# Patient Record
Sex: Male | Born: 1975 | Race: Asian | Hispanic: No | Marital: Married | Smoking: Never smoker
Health system: Southern US, Community
[De-identification: ages and names within clinical notes are randomized; demographics above are authoritative.]

## PROBLEM LIST (undated history)

## (undated) DIAGNOSIS — R739 Hyperglycemia, unspecified: Secondary | ICD-10-CM

## (undated) DIAGNOSIS — E785 Hyperlipidemia, unspecified: Secondary | ICD-10-CM

## (undated) DIAGNOSIS — S92009A Unspecified fracture of unspecified calcaneus, initial encounter for closed fracture: Secondary | ICD-10-CM

## (undated) DIAGNOSIS — I10 Essential (primary) hypertension: Secondary | ICD-10-CM

## (undated) DIAGNOSIS — R945 Abnormal results of liver function studies: Secondary | ICD-10-CM

## (undated) DIAGNOSIS — K760 Fatty (change of) liver, not elsewhere classified: Secondary | ICD-10-CM

## (undated) DIAGNOSIS — R7989 Other specified abnormal findings of blood chemistry: Secondary | ICD-10-CM

## (undated) HISTORY — DX: Essential (primary) hypertension: I10

## (undated) HISTORY — DX: Hyperlipidemia, unspecified: E78.5

## (undated) HISTORY — DX: Abnormal results of liver function studies: R94.5

## (undated) HISTORY — DX: Fatty (change of) liver, not elsewhere classified: K76.0

## (undated) HISTORY — DX: Other specified abnormal findings of blood chemistry: R79.89

## (undated) HISTORY — DX: Hyperglycemia, unspecified: R73.9

## (undated) HISTORY — DX: Unspecified fracture of unspecified calcaneus, initial encounter for closed fracture: S92.009A

---

## 2005-06-18 ENCOUNTER — Encounter: Payer: Self-pay | Admitting: Internal Medicine

## 2005-07-18 ENCOUNTER — Encounter: Payer: Self-pay | Admitting: Internal Medicine

## 2005-08-01 ENCOUNTER — Encounter: Admission: RE | Admit: 2005-08-01 | Discharge: 2005-08-01 | Payer: Self-pay | Admitting: Family Medicine

## 2005-08-01 ENCOUNTER — Encounter (INDEPENDENT_AMBULATORY_CARE_PROVIDER_SITE_OTHER): Payer: Self-pay | Admitting: *Deleted

## 2006-05-01 ENCOUNTER — Ambulatory Visit: Payer: Self-pay | Admitting: Internal Medicine

## 2006-10-15 ENCOUNTER — Encounter: Payer: Self-pay | Admitting: Internal Medicine

## 2006-10-15 ENCOUNTER — Emergency Department (HOSPITAL_COMMUNITY): Admission: EM | Admit: 2006-10-15 | Discharge: 2006-10-16 | Payer: Self-pay | Admitting: Emergency Medicine

## 2008-06-15 ENCOUNTER — Encounter: Payer: Self-pay | Admitting: Internal Medicine

## 2009-06-26 ENCOUNTER — Ambulatory Visit: Payer: Self-pay | Admitting: Internal Medicine

## 2009-06-26 DIAGNOSIS — E785 Hyperlipidemia, unspecified: Secondary | ICD-10-CM

## 2009-06-26 DIAGNOSIS — R740 Nonspecific elevation of levels of transaminase and lactic acid dehydrogenase [LDH]: Secondary | ICD-10-CM

## 2009-06-26 DIAGNOSIS — K7689 Other specified diseases of liver: Secondary | ICD-10-CM

## 2009-06-30 ENCOUNTER — Ambulatory Visit: Payer: Self-pay | Admitting: Internal Medicine

## 2009-06-30 LAB — CONVERTED CEMR LAB
AST: 53 units/L — ABNORMAL HIGH (ref 0–37)
Alkaline Phosphatase: 50 units/L (ref 39–117)
Bilirubin, Direct: 0.2 mg/dL (ref 0.0–0.3)
Calcium: 9.3 mg/dL (ref 8.4–10.5)
Direct LDL: 145.6 mg/dL
GFR calc non Af Amer: 102.53 mL/min (ref >60)
Glucose, Bld: 108 mg/dL — ABNORMAL HIGH (ref 70–99)
HDL: 34 mg/dL — ABNORMAL LOW (ref >39.00)
Hgb A1c MFr Bld: 5.4 % (ref 4.6–6.5)
Iron: 111 ug/dL (ref 42–165)
Potassium: 4.5 meq/L (ref 3.5–5.1)
Sodium: 140 meq/L (ref 135–145)
TSH: 1.32 microintl units/mL (ref 0.35–5.50)
Total Bilirubin: 1.5 mg/dL — ABNORMAL HIGH (ref 0.3–1.2)
Total CHOL/HDL Ratio: 6
VLDL: 38.6 mg/dL (ref 0.0–40.0)

## 2009-07-05 ENCOUNTER — Telehealth: Payer: Self-pay | Admitting: Internal Medicine

## 2009-09-06 ENCOUNTER — Telehealth: Payer: Self-pay | Admitting: Internal Medicine

## 2009-09-06 ENCOUNTER — Ambulatory Visit: Payer: Self-pay | Admitting: Internal Medicine

## 2009-09-06 LAB — CONVERTED CEMR LAB
ALT: 66 units/L — ABNORMAL HIGH (ref 0–53)
Bilirubin, Direct: 0.2 mg/dL (ref 0.0–0.3)
Direct LDL: 153.2 mg/dL
HDL: 31.9 mg/dL — ABNORMAL LOW (ref >39.00)
Total Bilirubin: 1.6 mg/dL — ABNORMAL HIGH (ref 0.3–1.2)
Triglycerides: 94 mg/dL (ref 0.0–149.0)
VLDL: 18.8 mg/dL (ref 0.0–40.0)

## 2009-10-31 ENCOUNTER — Telehealth: Payer: Self-pay | Admitting: Internal Medicine

## 2009-11-02 ENCOUNTER — Encounter: Payer: Self-pay | Admitting: Internal Medicine

## 2009-11-06 ENCOUNTER — Ambulatory Visit: Payer: Self-pay | Admitting: Internal Medicine

## 2009-11-06 LAB — CONVERTED CEMR LAB
ALT: 112 units/L — ABNORMAL HIGH (ref 0–53)
AST: 70 units/L — ABNORMAL HIGH (ref 0–37)
Bilirubin, Direct: 0.2 mg/dL (ref 0.0–0.3)
Cholesterol: 119 mg/dL (ref 0–200)
Indirect Bilirubin: 0.5 mg/dL (ref 0.0–0.9)
Total CHOL/HDL Ratio: 3.2
Total Protein: 7.2 g/dL (ref 6.0–8.3)
Triglycerides: 142 mg/dL (ref <150)
VLDL: 28 mg/dL (ref 0–40)

## 2009-11-12 ENCOUNTER — Telehealth: Payer: Self-pay | Admitting: Internal Medicine

## 2009-12-26 ENCOUNTER — Encounter (INDEPENDENT_AMBULATORY_CARE_PROVIDER_SITE_OTHER): Payer: Self-pay | Admitting: *Deleted

## 2009-12-26 ENCOUNTER — Ambulatory Visit: Payer: Self-pay | Admitting: Internal Medicine

## 2010-02-06 ENCOUNTER — Ambulatory Visit: Payer: Self-pay | Admitting: Internal Medicine

## 2010-02-06 LAB — CONVERTED CEMR LAB
ALT: 47 units/L (ref 0–53)
Anti Nuclear Antibody(ANA): NEGATIVE
IgA: 287 mg/dL (ref 68–378)
Tissue Transglutaminase Ab, IgA: 0.7 units (ref <7)
Total Protein: 7.3 g/dL (ref 6.0–8.3)

## 2010-02-14 ENCOUNTER — Encounter: Payer: Self-pay | Admitting: Internal Medicine

## 2010-02-16 ENCOUNTER — Telehealth: Payer: Self-pay | Admitting: Internal Medicine

## 2010-03-16 ENCOUNTER — Telehealth: Payer: Self-pay | Admitting: Internal Medicine

## 2010-03-21 ENCOUNTER — Ambulatory Visit: Payer: Self-pay | Admitting: Internal Medicine

## 2010-03-22 LAB — CONVERTED CEMR LAB
ALT: 51 units/L (ref 0–53)
AST: 27 units/L (ref 0–37)
Alkaline Phosphatase: 50 units/L (ref 39–117)
Bilirubin, Direct: 0.1 mg/dL (ref 0.0–0.3)
Total Bilirubin: 0.7 mg/dL (ref 0.3–1.2)
Total Protein: 7 g/dL (ref 6.0–8.3)

## 2010-06-21 ENCOUNTER — Telehealth: Payer: Self-pay | Admitting: Internal Medicine

## 2010-07-10 ENCOUNTER — Ambulatory Visit: Payer: Self-pay | Admitting: Internal Medicine

## 2010-07-10 LAB — CONVERTED CEMR LAB
ALT: 48 units/L (ref 0–53)
AST: 29 units/L (ref 0–37)
Albumin: 4.2 g/dL (ref 3.5–5.2)
Alkaline Phosphatase: 54 units/L (ref 39–117)
Bilirubin, Direct: 0.1 mg/dL (ref 0.0–0.3)
Cholesterol: 215 mg/dL — ABNORMAL HIGH (ref 0–200)
Direct LDL: 152.4 mg/dL
HDL: 34.5 mg/dL — ABNORMAL LOW (ref >39.00)
Total Bilirubin: 0.9 mg/dL (ref 0.3–1.2)
Total CHOL/HDL Ratio: 6
Total Protein: 6.8 g/dL (ref 6.0–8.3)
Triglycerides: 147 mg/dL (ref 0.0–149.0)
VLDL: 29.4 mg/dL (ref 0.0–40.0)

## 2010-07-13 ENCOUNTER — Ambulatory Visit: Payer: Self-pay | Admitting: Internal Medicine

## 2010-08-17 ENCOUNTER — Telehealth: Payer: Self-pay | Admitting: Internal Medicine

## 2010-08-17 ENCOUNTER — Ambulatory Visit: Payer: Self-pay | Admitting: Internal Medicine

## 2010-08-17 LAB — CONVERTED CEMR LAB
ALT: 64 units/L — ABNORMAL HIGH (ref 0–53)
AST: 33 units/L (ref 0–37)
Alkaline Phosphatase: 60 units/L (ref 39–117)
Bilirubin, Direct: 0.1 mg/dL (ref 0.0–0.3)
LDL Cholesterol: 82 mg/dL (ref 0–99)
Total Bilirubin: 1 mg/dL (ref 0.3–1.2)
Total CHOL/HDL Ratio: 5
Triglycerides: 193 mg/dL — ABNORMAL HIGH (ref 0.0–149.0)

## 2010-10-08 ENCOUNTER — Ambulatory Visit: Payer: Self-pay | Admitting: Internal Medicine

## 2010-10-25 ENCOUNTER — Ambulatory Visit: Payer: Self-pay | Admitting: Internal Medicine

## 2011-01-09 ENCOUNTER — Telehealth: Payer: Self-pay | Admitting: Internal Medicine

## 2011-01-29 NOTE — Progress Notes (Signed)
Summary: Triage  Phone Note Call from Patient Call back at Home Phone [phone]   Caller: Patient Call For: Dr. Leone Payor Reason for Call: Talk to Nurse Summary of Call: Pt. would like to talk to you about the bloodwork that he is sch'd to have done. Initial call taken by: Karna Christmas,  March 16, 2010 10:17 AM  Follow-up for Phone Call        Patient  is requesting we add on a fasting lipid panel to his labs.  Is this ok? Follow-up by: Darcey Nora RN, CGRN,  March 16, 2010 10:19 AM  Additional Follow-up for Phone Call Additional follow up Details #1::        yes he has hyperlipidemia and ? of fatty liver, too Additional Follow-up by: Iva Boop MD, Clementeen Graham,  March 16, 2010 10:48 AM    Additional Follow-up for Phone Call Additional follow up Details #2::    new orders added to lab, patient aware to come for labs at his convenience and be fasting. Follow-up by: Darcey Nora RN, CGRN,  March 16, 2010 10:59 AM

## 2011-01-29 NOTE — Assessment & Plan Note (Signed)
Summary: 6 MONTH FOLLOW UP/MHF   Vital Signs:  Patient profile:   35 year old male Height:      71 inches Weight:      209.75 pounds BMI:     29.36 O2 Sat:      98 % on Room air Temp:     97.9 degrees F oral Pulse rate:   56 / minute Pulse rhythm:   regular Resp:     20 per minute BP sitting:   122 / 80  (right arm) Cuff size:   large  Vitals Entered By: Glendell Docker CMA (July 13, 2010 8:37 AM)  O2 Flow:  Room air CC: Rm 3- 6 Month Follow up  Is Patient Diabetic? No Pain Assessment Patient in pain? no       Does patient need assistance? Functional Status Self care Ambulation Normal Comments no concerns   Primary Care Provider:  DThomos Lemons DO  CC:  Rm 3- 6 Month Follow up .  History of Present Illness: 35 y/o Asian male for f/u re:  fatty liver and abnl LFTs GI work up reviewed  started to make some dietary changes not exercising regularly    Preventive Screening-Counseling & Management  Alcohol-Tobacco     Smoking Status: never  Allergies (verified): No Known Drug Allergies  Past History:  Past Medical History: Hyperlipidemia Hx of fatty liver   Hx of abnormal LFTs presumed secondary to lovastatin and fatty liver   Past Surgical History: NONE    Family History: DM-MOTHER HTN-MOTHER STORKE-MATERNAL GF CAD- MATERNAL UNCLE     No FH of Colon Cancer:    Physical Exam  General:  alert, well-developed, and well-nourished.   Neck:  No deformities, masses, or tenderness noted.no carotid bruits.   Lungs:  normal respiratory effort, normal breath sounds, and no wheezes.   Heart:  normal rate, regular rhythm, and no gallop.     Impression & Recommendations:  Problem # 1:  FATTY LIVER DISEASE (ICD-571.8) LFTs improved. He defers trial of metformin or actos He is motivated to start lifestyle / dietary changes  Problem # 2:  HYPERLIPIDEMIA (ICD-272.4) Crestor and pravastatin caused LFT elevation  trial of Livalo.  samples  provided  His updated medication list for this problem includes:    Livalo 1 Mg Tabs (Pitavastatin calcium) ..... One by mouth once daily  Complete Medication List: 1)  Fish Oil Oil (Fish oil) .... One tablet by mouth once daily 2)  One-a-day Mens Tabs (Multiple vitamin) .... One tablet by mouth once daily 3)  Livalo 1 Mg Tabs (Pitavastatin calcium) .... One by mouth once daily  Patient Instructions: 1)  Avoid high fructose corn syrup and refined sugar 2)  Start regular exercise program 3)  Follow Mediterranean diet 4)  Please schedule a follow-up appointment in 3 months. 5)  Hepatic Panel prior to visit, ICD-9: 272.4 6)  Lipid Panel prior to visit, ICD-9: 272.4 7)  Please return for lab work within one month of starting Livalo Prescriptions: LIVALO 1 MG TABS (PITAVASTATIN CALCIUM) one by mouth once daily  #30 x 5   Entered and Authorized by:   D. Thomos Lemons DO   Signed by:   D. Thomos Lemons DO on 07/13/2010   Method used:   Print then Give to Patient   RxID:   [id number]   Current Allergies (reviewed today): No known allergies

## 2011-01-29 NOTE — Assessment & Plan Note (Signed)
Summary: Abnormal Ferritin and liver chemistries  [name] [name] MR#:  [id number] Page #  NAME:  [name] [name]  OFFICE NO:  [id number]  DATE:  05/01/06  DOB:  11/23/1976  REQUESTING PHYSICIAN:  Mosetta Putt, MD  REASON FOR CONSULTATION:  Abnormal ferritin and liver chemistries.  ASSESSMENT:  Clinical scenario is most compatible with nonalcoholic steatohepatitis or just fatty liver.  Cannot distinguish hepatitis without a liver biopsy.  I think his abnormal transaminases, which are in the 1-1/2 to 3 times abnormal range, are almost certainly related to that.  Ferritin is mildly elevated at 337, iron saturation is 28%, I do not think he has hemochromatosis, plus he is Asian, making that much less likely.  He is overweight, with a body mass index of approximately 28.  His mother has diabetes and is overweight and has also had heart disease.  He (and she) must have metabolic syndrome problems.  RECOMMENDATIONS AND PLAN:   1.  Weight loss is discussed and he is advised to look into [employer] or [employer] or discuss further with Dr. Duaine Dredge. 2.  I think it is reasonable to start statin therapy for his hyperlipidemia.  Though there are concerns about the elevated LFTs, statins have been proven to be safe in chronic liver disease in recent studies that have been released; in fact, they may even help his problem and drive his LFTs down by improving fatty liver.  Obviously, if they rise in the face of this then we would need to look further into things.  A liver biopsy could be indicated, but I do not think that would alter therapy right now so I am not advising that.   3.  I did discuss with him the importance of acting now and losing weight, modifying his diet, continuing exercise and treating these problems so that he does not develop vascular disease and diabetes later on in life.  A fatty liver handout was given to the patient as well. 4.  I have recommended he follow up with Dr. Duaine Dredge  regarding additional treatment.  I do not think any further liver workup is needed right at this time.  I would be happy to see him back if needed.  Check LFT's every 4-6 weeks to start.  HISTORY:  A pleasant 35 year old [place] man with the above problems.  Laboratory testing revealed abnormal liver chemistries.  He does not have any obvious symptoms.  On April 03, 2006 his AST was 77, ALT 133 with normal albumin, bilirubin, etc.  Alkaline phosphatase was normal as well.  Total cholesterol 236, triglycerides 210, HDL 36, VLDL 42 and LDL 158.  His ferritin is 337 with ceruloplasmin once was slightly low on a study of 17.3, but then a subsequent repeat showed it to be in the normal range of 17.2.  His AST was 45, ALT 106 in August of 2006.  Hemoglobin was slightly high at 16.5 with hematocrit 46%, AST 56 and ALT 144 on July 18, 2005.  Cholesterol was 219 then.  Triglycerides 180.  TSH normal.  Hepatitis C antibody, hepatitis B surface antigen, hepatitis B core antibody normal on July 18, 2005.  Ferritin was 324 then.  His ANA was negative at that time.  He was not immune to hepatitis B.  He is being administered hepatitis B vaccine, which is reasonable.  CT scan of the abdomen and pelvis has demonstrated diffuse low attenuation of the liver consistent with fatty liver.  He does not drink alcohol.  He has no known history of liver problems.    MEDICATIONS:  Fish oil and One-A-Day vitamin.   DRUG ALLERGIES:  None known.  PAST MEDICAL HISTORY: Shows what is mentioned above.    FAMILY HISTORY:  Is as described above.  SOCIAL HISTORY:  He is married.  He is an Systems developer.  Naval architect.  No alcohol, tobacco or drugs.  REVIEW OF SYSTEMS:  Some fatigue and some vision changes related to work and computer time.  He wears eyeglasses.  All other systems are negative.  PHYSICAL EXAM: Reveals a well-developed young Asian man, overweight, height 5 feet 11 and 205 pounds.  Blood pressure is 138/72.  Pulse 80.   Eyes:  Anicteric.  ENT:  Normal mouth and oropharynx.  Neck:  Supple, no thyromegaly.  Chest:  Clear.  Heart:  S1, S2, no rubs, murmurs or gallops.  Abdomen:  Soft, nontender, without organomegaly or mass.  Extremities show trace lower extremity edema bilaterally.  Skin:  Free of rash or signs of chronic liver disease.  Neuro:  He is alert and oriented x3.  Lymphatics:  No neck or supraclavicular nodes.    I appreciate the opportunity to care for this patient.  I have reviewed labs and office notes from Dr. Duaine Dredge.  I reviewed the CT report as well.     Iva Boop, M.D., F.A.C.G.  [id number] cc:  Mosetta Putt, MD D:  05/01/06; T:  ; Job [id number]

## 2011-01-29 NOTE — Assessment & Plan Note (Signed)
Summary: 35 MONTH FU/DT   Vital Signs:  Patient profile:   35 year old male Height:      71 inches Weight:      209 pounds BMI:     29.25 O2 Sat:      97 % on Room air Temp:     97.4 degrees F oral Pulse rate:   72 / minute Pulse rhythm:   regular Resp:     16 per minute BP sitting:   110 / 70  (right arm) Cuff size:   large  Vitals Entered By: Glendell Docker CMA (October 25, 2010 3:29 PM)  O2 Flow:  Room air CC: 3 month follow up  Is Patient Diabetic? No Pain Assessment Patient in pain? no      Comments discuss blood work and medication changes   Primary Care Provider:  D. Thomos Lemons DO  CC:  3 month follow up .  History of Present Illness:  Hyperlipidemia Follow-Up      This is a 35 year old man who presents for Hyperlipidemia follow-up.  The patient denies muscle aches and GI upset.  The patient denies the following symptoms: chest pain/pressure.  Compliance with medications (by patient report) has been near 100%.  Dietary compliance has been fair.  The patient reports no exercise.    Preventive Screening-Counseling & Management  Alcohol-Tobacco     Smoking Status: never  Allergies (verified): No Known Drug Allergies  Past History:  Past Medical History: Hyperlipidemia  Hx of fatty liver   Hx of abnormal LFTs presumed secondary to lovastatin and fatty liver   Past Surgical History: NONE     Family History: DM-MOTHER HTN-MOTHER STORKE-MATERNAL GF CAD- MATERNAL UNCLE     No FH of Colon Cancer:      Social History: Married Never Smoked Alcohol use-no (occasional) 1x/month 1-2 beers at a time...trying to reduce Drug use-no  Regular exercise-yes   Occupation:  Programmer  Physical Exam  General:  alert, well-developed, and well-nourished.   Lungs:  normal respiratory effort, normal breath sounds, and no wheezes.   Heart:  normal rate, regular rhythm, and no gallop.   Extremities:  No lower extremity edema    Impression &  Recommendations:  Problem # 1:  HYPERLIPIDEMIA (ICD-272.4) Assessment Improved  His updated medication list for this problem includes:    Livalo 1 Mg Tabs (Pitavastatin calcium) ..... One by mouth once daily  Labs Reviewed: SGOT: 33 (08/17/2010)   SGPT: 64 (08/17/2010)   HDL:32.00 (08/17/2010), 34.50 (07/10/2010)  LDL:82 (08/17/2010), 54 (11/06/2009)  Chol:153 (08/17/2010), 215 (07/10/2010)  Trig:193.0 (08/17/2010), 147.0 (07/10/2010)  Problem # 2:  TRANSAMINASES, SERUM, ELEVATED (ICD-790.4) Assessment: Improved  Problem # 3:  FATTY LIVER DISEASE (ICD-571.8) Pt counseled on diet and exercise.  Complete Medication List: 1)  Fish Oil Oil (Fish oil) .... One tablet by mouth once daily 2)  One-a-day Mens Tabs (Multiple vitamin) .... One tablet by mouth once daily 3)  Livalo 1 Mg Tabs (Pitavastatin calcium) .... One by mouth once daily  Patient Instructions: 1)  Please schedule a follow-up appointment in 1 year. 2)  Hepatic Panel prior to visit, ICD-9:  272.4 3)  Lipid Panel prior to visit, ICD-9: 272.4 4)  HbgA1C prior to visit, ICD-9:  790.29 5)  High sensitivity CRP :  272.4 6)  Please return for lab work one (1) week before your next appointment.  Prescriptions: LIVALO 1 MG TABS (PITAVASTATIN CALCIUM) one by mouth once daily  #90 x 3  Entered and Authorized by:   D. Thomos Lemons DO   Signed by:   D. Thomos Lemons DO on 10/25/2010   Method used:   Electronically to        [id number]* (retail)       [address]       [place], [place]  [address]       Ph: [phone] or [phone]       Fax: [phone]   RxID:   [id number]    Orders Added: 1)  Est. Patient Level III [27253]   Immunization History:  Influenza Immunization History:    Influenza:  historical (09/18/2010)  Tetanus/Td Immunization History:    Tetanus/Td:  historical (10/16/2010)   Immunization History:  Influenza Immunization History:    Influenza:   Historical (09/18/2010)  Tetanus/Td Immunization History:    Tetanus/Td:  Historical (10/16/2010)  Current Allergies (reviewed today): No known allergies

## 2011-01-29 NOTE — Progress Notes (Signed)
Summary: Blood Work  Phone Note Call from Patient Call back at [employer] [phone]   Caller: Patient Summary of Call: voice message left from male individual stating patient is requesting blood work to be drawn in [place]. Message states patient is requesting a Lipid and Hepatic. He has a follow up appointment scheduled for 7/15 @ 8:30  with Dr Artist Pais Initial call taken by: Glendell Docker CMA,  June 21, 2010 11:45 AM  Follow-up for Phone Call        LFTs, FLP - 272.4 Follow-up by: D. Thomos Lemons DO,  June 21, 2010 5:29 PM  Additional Follow-up for Phone Call Additional follow up Details #1::        labs entered for the week of July 5th for [facility] per patient request, attempted to contact  [phone], no answer,detailed voice message left informing patient of fasting blood work Additional Follow-up by: Glendell Docker CMA,  June 22, 2010 2:09 PM

## 2011-01-29 NOTE — Letter (Signed)
Summary: Results Letter  [facility]  [address] [place], [place] [address]   Phone: [phone]  Fax: [phone]        February 14, 2010 MRN: [id number]    [name] [name] [address] [place], [place]  [address]    Dear [name] [name],   I have been unable to reach you by phone.  Dr Leone Payor has a question about your Hepatitis B vaccines you recieved and if you have had any follow up lab work performed to ensure the vaccines were effective.  Please call [phone] and ask to speak with Lavonna Rua to discuss further.  I look forward to hearing from you.      Sincerely,  Darcey Nora RN, CGRN  This letter has been electronically signed by your physician.

## 2011-01-29 NOTE — Assessment & Plan Note (Signed)
Summary: ELEVATED LFT'S--[facility].   History of Present Illness Visit Type: consult  Primary GI MD: Stan Head MD [facility] Primary Provider: Dondra Spry DO Requesting Provider: Dondra Spry DO Chief Complaint: Elevated LFTs  History of Present Illness:   35 year old [place] man with known abnormal transaminases. Previously seen by me in 2007 when he had a different primary care physician. At that time the thinking was that he had fatty liver based upon CT scan results as well as a laboratory workup which will be outlined in the assessment plan. He has continued to have abnormal transaminases and it is requested to evaluate this by his new primary care physician, Dr. Artist Pais.  He has no symptoms whatsoever. His weight is essentially unchanged at 205 pounds. He drinks alcohol perhaps once a month one to 2 beers, and is trying to reduce that in the face of his abnormal liver chemistries. He is here with his wife, a nurse at the hospital. They Better questioning if he is adequately vaccinated, records and his memory indicate that he was in the process of being vaccinated PIPIDA today and hepatitis B. His wife is particularly puzzled because his hepatitis B surface antibody was negative.  There has been concern that statins have caused his abnormal transaminases in the past as his transaminases rose while on Lipitor and Crestor was recently stopped due to these abnormal labs.   GI Review of Systems      Denies abdominal pain, acid reflux, belching, bloating, chest pain, dysphagia with liquids, dysphagia with solids, heartburn, loss of appetite, nausea, vomiting, vomiting blood, weight loss, and  weight gain.      Reports liver problems.     Denies anal fissure, black tarry stools, change in bowel habit, constipation, diarrhea, diverticulosis, fecal incontinence, heme positive stool, hemorrhoids, irritable bowel syndrome, jaundice, light color stool, rectal bleeding, and  rectal pain.    Current  Medications (verified): 1)  Fish Oil   Oil (Fish Oil) .... One Tablet By Mouth Once Daily 2)  One-A-Day Mens  Tabs (Multiple Vitamin) .... One Tablet By Mouth Once Daily  Allergies (verified): No Known Drug Allergies  Past History:  Past Medical History: Reviewed history from 12/26/2009 and no changes required. Hyperlipidemia Hx of fatty liver   Hx of abnormal LFTs presumed secondary to lovastatin and fatty liver  Past Surgical History: Reviewed history from 12/26/2009 and no changes required. NONE   Family History: DM-MOTHER HTN-MOTHER STORKE-MATERNAL GF CAD- MATERNAL UNCLE     No FH of Colon Cancer:  Social History: Married Never Smoked Alcohol use-no (occasional) 1x/month 1-2 beers at a time...trying to reduce Drug use-no  Regular exercise-yes  Occupation:  Programmer  Review of Systems       All other ROS negative except as per HPI.   Vital Signs:  Patient profile:   35 year old male Height:      71 inches Weight:      206 pounds BMI:     28.84 BSA:     1.87 Pulse rate:   94 / minute Pulse rhythm:   regular BP sitting:   136 / 84  (left arm) Cuff size:   regular  Vitals Entered By: Ok Anis CMA (February 06, 2010 3:05 PM)  Physical Exam  General:  alert, well-developed, and well-nourished.   Eyes:  pupils equal, pupils round, and pupils reactive to light.  anicteric Mouth:  Oral mucosa and oropharynx without lesions or exudates.  Teeth in good repair. Neck:  No deformities, masses, or tenderness  Lungs:  normal respiratory effort, normal breath sounds, and no wheezes.   Heart:  normal rate, regular rhythm, and no gallop.   Abdomen:  soft, non-tender, no masses, no hepatomegaly, and no splenomegaly.   Extremities:  No lower extremity edema  Skin:  no stigmata of chronic liver disease (spiders, palmar erythema) Cervical Nodes:  No significant cervical or supraclavicular adenopathy.  Psych:  Alert and cooperative. Normal mood and  affect.   Impression & Recommendations:  Problem # 1:  TRANSAMINASES, SERUM, ELEVATED (ICD-790.4) Long-standing problem going back to 2006 at least. He has had persistent abnormal transaminases 1.5-3 times abnormal. A CT scan in 2006 demonstrated low attenuation throughout the liver and in the context of his hyperlipidemia and other test results and has been thought that he has fatty liver disease. It still seems likely though we have not proven that with a biopsy. His lab work up has shown a ferritin of 337 in 2007, iron saturation 28%. Ceruloplasmin low at 17.3 and than normal. Hepatitis C antibody, the surface antigen, be cord antibody and were normal or negative in July 2006. ANA -2006. AST 70, ALT 112 in November 2010. Alkaline phosphatase and bilirubin have always been normal.  Fatty liver still seems like the most likely diagnosis, do not know if BX we has hepatitis. I have explained the liver biopsy would be the most accurate way to examine this. Reactions to statin therapy is also in the differential. We will perform additional laboratory testing as outlined below and then discuss further. I have also discussed the possibility of a consult at the [facility] where they have expertise in fatty liver disease. He could lose some weight though his BMI is 28 he is certainly not significantly obese i.e. morbidly so. His overall situation is a little unusual in my opinion given his ethnicity and his BMI. We will also try to confirm that he completed his hepatitis A and B. vaccinations the record release. Orders: TLB-Hepatic/Liver Function Pnl ([id number]) T-ANA [id number]) T-Anti SMA ([id number]) T-Sprue Panel (Celiac Disease Aby Eval) (83516x3/[id number]) TLB-IgA (Immunoglobulin A) ([id number])  Patient Instructions: 1)  Please go to the basement to have your lab tests drawn today.  2)  We will request verification of Hepatitis A and B vaccines from Dr. Geoffery Lyons office. 3)  We  will call you with follow up after we have lab results and verification from Dr. Duaine Dredge. 4)  Copy sent to : Thomos Lemons, DO 5)  The medication list was reviewed and reconciled.  All changed / newly prescribed medications were explained.  A complete medication list was provided to the patient / caregiver.  Appended Document: ELEVATED LFT'S--[facility]. vaccination records show completion of HAV vaccine in 2006 and completed HBV vaccine but there was about 15 months between second and third doses I do not see ehere his Hep B surface antibody has been tested since then so we need to clarify with wife about that (has it been done since 2008) if it hasn't then could recheck to see if immune other studies not all bacl yet...can wait til they are to call  Appended Document: ELEVATED LFT'S--[facility]. Left message for patient to call back    Appended Document: ELEVATED LFT'S--[facility]. Have tried to reach patient , I will send him and his wife a letter

## 2011-01-29 NOTE — Progress Notes (Signed)
Summary: labwork  Phone Note Call from Patient Call back at Home Phone [phone]   Caller: Patient Call For: Dr. Leone Payor Reason for Call: Talk to Nurse Summary of Call: pt states that he received a letter and a voicemail from [facility] and would like to speak with her... pt says it is regarding his labwork Initial call taken by: Vallarie Mare,  February 16, 2010 10:46 AM  Follow-up for Phone Call        I spoke with the patient about his Hep B status.  Patient  wants to repeat the series at his work , because he can get if for free.  I explained that he may already be immune and we can draw a lab test to check.  Patient  doesn't want to have lab he just wants to reapeat the series.   Follow-up by: Darcey Nora RN, CGRN,  February 16, 2010 11:10 AM  Additional Follow-up for Phone Call Additional follow up Details #1::        He should be tested for Hep B Surface Ab I am not ordering the repeat series his LFT's returned to normal so perhaps it was statin Needs repeat Hepatic function panel in 1 month from last and could do Hep S Ab then Let me know Additional Follow-up by: Iva Boop MD, Clementeen Graham,  February 16, 2010 3:04 PM    Additional Follow-up for Phone Call Additional follow up Details #2::    Patient  is scheduled for lab work for next month.  he has agreed to wait on hep b series and have ab test done next month with lft. Follow-up by: Darcey Nora RN, CGRN,  February 16, 2010 4:09 PM   Appended Document: labwork Patient notified to come for repeat lab work this week.

## 2011-01-29 NOTE — Progress Notes (Signed)
Summary: Lab results  Phone Note Outgoing Call   Summary of Call: call [name] [name] - minimal elevation in ALT.  continue livalo.  repeat LFTs in 6 months mail copy of lab work to [name] [name] Initial call taken by: D. Thomos Lemons DO,  August 17, 2010 4:08 PM  Follow-up for Phone Call        call placed to patient at [phone], he was advised per Dr Artist Pais instructions Follow-up by: Glendell Docker CMA,  August 20, 2010 9:03 AM    Prescriptions: LIVALO 1 MG TABS (PITAVASTATIN CALCIUM) one by mouth once daily  #30 x 5   Entered and Authorized by:   D. Thomos Lemons DO   Signed by:   D. Thomos Lemons DO on 08/17/2010   Method used:   Electronically to        [id number] (retail)       [address]       [place], [place]  [address]       Ph: [phone] or [phone]       Fax: [phone]   RxID:   [id number]

## 2011-01-31 NOTE — Progress Notes (Signed)
Summary: [facility]  Phone Note Refill Request Call back at [phone] opt [phone] Message from:  Fax from Pharmacy on January 09, 2011 12:21 PM  Refills Requested: Medication #1:  LIVALO 1 MG TABS one by mouth once daily.   Dosage confirmed as above?Dosage Confirmed   Brand Name Necessary? No   Supply Requested: 3 months phone call recived from [facility], stating patient is requesting a 90 day supply for Livalo 52m once daily form them, and the pharmacy is requesting a new rx.  Reference # [id number]   Method Requested: Fax to Mail Away Pharmacy Next Appointment Scheduled: No future appointments on file Initial call taken by: Glendell Docker CMA,  January 09, 2011 12:23 PM  Follow-up for Phone Call        Rx faxed to pharmacy Follow-up by: Glendell Docker CMA,  January 09, 2011 12:57 PM    Prescriptions: LIVALO 1 MG TABS (PITAVASTATIN CALCIUM) one by mouth once daily  #90 x 0   Entered by:   Glendell Docker CMA   Authorized by:   D. Thomos Lemons DO   Signed by:   Glendell Docker CMA on 01/09/2011   Method used:   Faxed to ...       [address] [facility] (mail-order)       [address]       [place], [place]  [address]       Ph: [phone]       Fax: [phone]   RxID:   [id number]

## 2011-02-19 ENCOUNTER — Other Ambulatory Visit: Payer: Self-pay

## 2011-02-20 ENCOUNTER — Telehealth: Payer: Self-pay | Admitting: Internal Medicine

## 2011-02-20 ENCOUNTER — Other Ambulatory Visit: Payer: Self-pay | Admitting: Internal Medicine

## 2011-02-20 ENCOUNTER — Encounter (INDEPENDENT_AMBULATORY_CARE_PROVIDER_SITE_OTHER): Payer: Self-pay | Admitting: *Deleted

## 2011-02-20 ENCOUNTER — Other Ambulatory Visit: Payer: Managed Care, Other (non HMO)

## 2011-02-20 DIAGNOSIS — R740 Nonspecific elevation of levels of transaminase and lactic acid dehydrogenase [LDH]: Secondary | ICD-10-CM

## 2011-02-20 LAB — HEPATIC FUNCTION PANEL
Albumin: 4.4 g/dL (ref 3.5–5.2)
Alkaline Phosphatase: 43 U/L (ref 39–117)
Total Bilirubin: 1.1 mg/dL (ref 0.3–1.2)

## 2011-02-26 NOTE — Progress Notes (Signed)
Summary: lab results  Phone Note Outgoing Call   Summary of Call: call pt - LFTs stable.  repeat FLP and LFTs in 6 months Initial call taken by: D. Thomos Lemons DO,  February 20, 2011 9:48 PM  Follow-up for Phone Call        Left message on machine to return my call. Nicki Guadalajara Fergerson CMA Duncan Dull)  February 21, 2011 10:13 AM   Additional Follow-up for Phone Call Additional follow up Details #1::        call placed to patient at [phone], he has been informed per Dr Artist Pais instructions. Patient request copy of labs mailed to him. Results from 02/20/2011 have been printed and mailed to patient per his request.  6 month labs have been entered for [facility] for the month of August 2012 for Lipid and Hepatic Additional Follow-up by: Glendell Docker CMA,  February 22, 2011 8:30 AM

## 2011-04-24 ENCOUNTER — Other Ambulatory Visit: Payer: Self-pay | Admitting: *Deleted

## 2011-04-24 NOTE — Telephone Encounter (Signed)
Patient called and left voice message requesting refill on his Cholesterol medication. His message also states he was not sure if he was due for follow up and blood work.   Call placed to patient at [phone], he has requested  Printed rx to sent to him at his home address for mail order. He was reminded to return in August for fasting blood work.

## 2011-05-08 ENCOUNTER — Telehealth: Payer: Self-pay | Admitting: Internal Medicine

## 2011-05-08 MED ORDER — PITAVASTATIN CALCIUM 1 MG PO TABS: 1.0000 mg | ORAL_TABLET | Freq: Every day | ORAL | Status: DC

## 2011-05-08 NOTE — Telephone Encounter (Signed)
Call placed to patient at [phone], no answer. A voice message was left informing patient Rx sent to pharmacy. He was reminded of follow up blood due in August

## 2011-05-08 NOTE — Telephone Encounter (Signed)
Darlene said she called the online rx service for his prescriptions on 4-25 and he has not received his meds.

## 2011-05-09 ENCOUNTER — Telehealth: Payer: Self-pay | Admitting: Internal Medicine

## 2011-05-09 NOTE — Telephone Encounter (Signed)
[id number] TO AUTHORIZE REFILL OF CHOLESTEROL MEDICINE  [employer] [facility]

## 2011-05-10 NOTE — Telephone Encounter (Signed)
Rx refill sent to [facility] on 05/08/2011.

## 2011-05-13 ENCOUNTER — Telehealth: Payer: Self-pay | Admitting: *Deleted

## 2011-05-13 MED ORDER — PITAVASTATIN CALCIUM 1 MG PO TABS: 1.0000 mg | ORAL_TABLET | Freq: Every day | ORAL | Status: DC

## 2011-05-13 NOTE — Telephone Encounter (Signed)
Rx refill  Has been addressed, after failed electronic Rx error for 05/13/2011

## 2011-05-13 NOTE — Telephone Encounter (Signed)
Call placed to [employer] at [phone], spoke with CSR Tawana who obtained refill information for the pharmacist. Rx has been called into pharmacist [place] with [facility]. Call placed to patient at [phone], no answer. A voice message was left informing patient Rx called into pharmacy

## 2011-08-16 ENCOUNTER — Encounter: Payer: Self-pay | Admitting: Internal Medicine

## 2011-08-16 ENCOUNTER — Other Ambulatory Visit: Payer: Self-pay | Admitting: Internal Medicine

## 2011-08-16 ENCOUNTER — Ambulatory Visit (INDEPENDENT_AMBULATORY_CARE_PROVIDER_SITE_OTHER): Payer: Managed Care, Other (non HMO) | Admitting: Internal Medicine

## 2011-08-16 VITALS — BP 138/87 | HR 72 | Temp 97.9°F | Resp 16

## 2011-08-16 DIAGNOSIS — E785 Hyperlipidemia, unspecified: Secondary | ICD-10-CM

## 2011-08-16 DIAGNOSIS — N50819 Testicular pain, unspecified: Secondary | ICD-10-CM

## 2011-08-16 DIAGNOSIS — N453 Epididymo-orchitis: Secondary | ICD-10-CM

## 2011-08-16 DIAGNOSIS — N451 Epididymitis: Secondary | ICD-10-CM

## 2011-08-16 LAB — URINALYSIS
Glucose, UA: NEGATIVE mg/dL
Ketones, ur: NEGATIVE mg/dL
Leukocytes, UA: NEGATIVE
Nitrite: NEGATIVE
Specific Gravity, Urine: 1.026 (ref 1.005–1.030)
pH: 6 (ref 5.0–8.0)

## 2011-08-16 MED ORDER — CIPROFLOXACIN HCL 500 MG PO TABS: 500.0000 mg | ORAL_TABLET | Freq: Two times a day (BID) | ORAL | Status: AC

## 2011-08-16 NOTE — Assessment & Plan Note (Signed)
Possible mild epididymitis. Obtain UA. Begin course of cipro. Followup if no improvement or worsening.

## 2011-08-16 NOTE — Progress Notes (Signed)
  Subjective:    Patient ID: [name] [name], male    DOB: 08-19-1976, 35 y.o.   MRN: [id number]  HPI Pt presents to clinic as a work in for evaluation of scrotal discomfort. Notes 3 wk h/o bilateral vague scrotal discomfort and heaviness. Denies change in urination, dysuria or urethral discharge. May worsen with sitting. No injury/trauma. No other exacerbating or alleviating factors. Taking no medication for the problem. No other complaint.  Reviewed pmh, medications and allergies.    Review of Systems see hpi     Objective:   Physical Exam  Vitals reviewed. Constitutional: He appears well-developed and well-nourished. No distress.  HENT:  Head: Normocephalic and atraumatic.  Eyes: Conjunctivae are normal. No scleral icterus.  Genitourinary: Penis normal.       Nl bilaterally descended testes without tenderness or nodularity. Mild discomfort to palpation bilateral epididymis. No scrotal mass, wound or rash.  Neurological: He is alert.  Skin: Skin is warm and dry. No rash noted. He is not diaphoretic. No erythema. No pallor.  Psychiatric: He has a normal mood and affect.          Assessment & Plan:

## 2011-08-20 ENCOUNTER — Other Ambulatory Visit: Payer: Managed Care, Other (non HMO)

## 2011-08-30 ENCOUNTER — Other Ambulatory Visit: Payer: Self-pay | Admitting: Internal Medicine

## 2011-08-30 ENCOUNTER — Ambulatory Visit: Payer: Managed Care, Other (non HMO)

## 2011-08-30 DIAGNOSIS — N50819 Testicular pain, unspecified: Secondary | ICD-10-CM

## 2011-08-30 DIAGNOSIS — E785 Hyperlipidemia, unspecified: Secondary | ICD-10-CM

## 2011-08-30 LAB — LIPID PANEL
HDL: 41.3 mg/dL (ref >39.00)
LDL Cholesterol: 101 mg/dL — ABNORMAL HIGH (ref 0–99)
Total CHOL/HDL Ratio: 4
Triglycerides: 111 mg/dL (ref 0.0–149.0)
VLDL: 22.2 mg/dL (ref 0.0–40.0)

## 2011-08-30 LAB — HEPATIC FUNCTION PANEL
Bilirubin, Direct: 0.2 mg/dL (ref 0.0–0.3)
Total Bilirubin: 1.2 mg/dL (ref 0.3–1.2)

## 2011-08-30 LAB — URINALYSIS
Leukocytes, UA: NEGATIVE
Specific Gravity, Urine: 1.025 (ref 1.000–1.030)
Urobilinogen, UA: 0.2 (ref 0.0–1.0)

## 2011-11-18 ENCOUNTER — Other Ambulatory Visit: Payer: Self-pay | Admitting: *Deleted

## 2011-11-18 DIAGNOSIS — E785 Hyperlipidemia, unspecified: Secondary | ICD-10-CM

## 2011-11-18 NOTE — Telephone Encounter (Signed)
Patients wife called and left voice message requesting refill on Livalo for patient,

## 2011-11-19 MED ORDER — PITAVASTATIN CALCIUM 1 MG PO TABS: 1.0000 mg | ORAL_TABLET | Freq: Every day | ORAL | Status: DC

## 2011-11-19 NOTE — Telephone Encounter (Signed)
Refill sent to [facility] for Livalo #90 x no refills. Pt last seen in August and has no future appt. When should pt follow up with [place]?

## 2011-11-19 NOTE — Telephone Encounter (Signed)
Attempted to reach pt and received message that voice mailbox has not been set up yet.  

## 2011-11-19 NOTE — Telephone Encounter (Signed)
Ok for [employer]. Needs f/u for chol jan or feb with lipid/lft 272.4

## 2011-11-20 NOTE — Telephone Encounter (Signed)
Call place to patient at [phone], he was advised per Dr Rodena Medin instructions and stated that he will have his blood work done at [facility] at the requested time.

## 2011-11-29 ENCOUNTER — Telehealth: Payer: Self-pay | Admitting: Family Medicine

## 2011-11-29 MED ORDER — PITAVASTATIN CALCIUM 1 MG PO TABS: 1.0000 mg | ORAL_TABLET | Freq: Every day | ORAL | Status: DC

## 2011-11-29 NOTE — Telephone Encounter (Signed)
rx sent in electronically, pt aware 

## 2011-11-29 NOTE — Telephone Encounter (Signed)
I submitted this patient's Livalo for Prior Auth because it was rejected. It looks like it was rejected because, per [employer]: effective December 30, 2008, the use of the [employer] [facility] for "maintenance drugs" was required after your prescription had been filled twice at a local retail pharmacy. If you choose to fill your "maintenance drugs" at a retail pharmacy after the first two fills, you will be required to pay the full price of these  drugs where you will not receive any discounts offered by [employer] under the [employer].   I think we need to try and fill this Livalo Rx through the [employer]. Thanks.

## 2011-11-29 NOTE — Telephone Encounter (Signed)
[name] [name],  Plz call pt and send rx for mail order

## 2012-01-31 ENCOUNTER — Observation Stay (HOSPITAL_COMMUNITY)
Admission: EM | Admit: 2012-01-31 | Discharge: 2012-02-01 | Disposition: A | Discharge status: Home / Self Care | Payer: Managed Care, Other (non HMO) | Attending: Internal Medicine | Admitting: Internal Medicine

## 2012-01-31 ENCOUNTER — Encounter (HOSPITAL_COMMUNITY): Payer: Self-pay | Admitting: Emergency Medicine

## 2012-01-31 ENCOUNTER — Other Ambulatory Visit: Payer: Self-pay

## 2012-01-31 DIAGNOSIS — N451 Epididymitis: Secondary | ICD-10-CM

## 2012-01-31 DIAGNOSIS — R079 Chest pain, unspecified: Principal | ICD-10-CM | POA: Insufficient documentation

## 2012-01-31 DIAGNOSIS — E785 Hyperlipidemia, unspecified: Secondary | ICD-10-CM | POA: Insufficient documentation

## 2012-01-31 DIAGNOSIS — K7689 Other specified diseases of liver: Secondary | ICD-10-CM

## 2012-01-31 MED ORDER — OMEGA-3-ACID ETHYL ESTERS 1 G PO CAPS
1.0000 g | ORAL_CAPSULE | Freq: Every day | ORAL | Status: DC
  Filled 2012-01-31 (×2): qty 1

## 2012-01-31 MED ORDER — OXYCODONE HCL 5 MG PO TABS: 5.0000 mg | ORAL_TABLET | ORAL | Status: DC | PRN

## 2012-01-31 MED ORDER — ZOLPIDEM TARTRATE 5 MG PO TABS: 5.0000 mg | ORAL_TABLET | Freq: Every evening | ORAL | Status: DC | PRN

## 2012-01-31 MED ORDER — HYDROMORPHONE HCL PF 1 MG/ML IJ SOLN: 0.5000 mg | INTRAMUSCULAR | Status: DC | PRN

## 2012-01-31 MED ORDER — PITAVASTATIN CALCIUM 1 MG PO TABS: 1.0000 mg | ORAL_TABLET | Freq: Every day | ORAL | Status: DC

## 2012-01-31 MED ORDER — ALUM & MAG HYDROXIDE-SIMETH 200-200-20 MG/5ML PO SUSP: 30.0000 mL | Freq: Four times a day (QID) | ORAL | Status: DC | PRN

## 2012-01-31 MED ORDER — ROSUVASTATIN CALCIUM 5 MG PO TABS
5.0000 mg | ORAL_TABLET | Freq: Every day | ORAL | Status: DC
  Filled 2012-01-31 (×2): qty 1

## 2012-01-31 MED ORDER — ONDANSETRON HCL 4 MG/2ML IJ SOLN: 4.0000 mg | Freq: Four times a day (QID) | INTRAMUSCULAR | Status: DC | PRN

## 2012-01-31 MED ORDER — SODIUM CHLORIDE 0.9 % IV SOLN
INTRAVENOUS | Status: DC
  Administered 2012-01-31: via INTRAVENOUS

## 2012-01-31 MED ORDER — ACETAMINOPHEN 650 MG RE SUPP: 650.0000 mg | Freq: Four times a day (QID) | RECTAL | Status: DC | PRN

## 2012-01-31 MED ORDER — ACETAMINOPHEN 325 MG PO TABS
650.0000 mg | ORAL_TABLET | Freq: Four times a day (QID) | ORAL | Status: DC | PRN
  Administered 2012-02-01: 650 mg via ORAL
  Filled 2012-01-31: qty 2

## 2012-01-31 MED ORDER — ONDANSETRON HCL 4 MG PO TABS: 4.0000 mg | ORAL_TABLET | Freq: Four times a day (QID) | ORAL | Status: DC | PRN

## 2012-01-31 MED ORDER — ADULT MULTIVITAMIN W/MINERALS CH
1.0000 | ORAL_TABLET | Freq: Every day | ORAL | Status: DC
  Administered 2012-02-01: 1 via ORAL
  Filled 2012-01-31: qty 1

## 2012-01-31 MED ORDER — ASPIRIN 325 MG PO TABS
325.0000 mg | ORAL_TABLET | Freq: Every day | ORAL | Status: DC
  Administered 2012-02-01: 325 mg via ORAL
  Filled 2012-01-31 (×2): qty 1

## 2012-01-31 MED ORDER — NITROGLYCERIN 2 % TD OINT
0.5000 [in_us] | TOPICAL_OINTMENT | Freq: Four times a day (QID) | TRANSDERMAL | Status: DC
  Administered 2012-01-31 – 2012-02-01 (×2): 0.5 [in_us] via TOPICAL
  Filled 2012-01-31: qty 30

## 2012-01-31 MED ORDER — ENOXAPARIN SODIUM 40 MG/0.4ML ~~LOC~~ SOLN
40.0000 mg | SUBCUTANEOUS | Status: DC
  Filled 2012-01-31 (×2): qty 0.4

## 2012-01-31 MED ORDER — ONE-DAILY MULTI VITAMINS PO TABS: 1.0000 | ORAL_TABLET | Freq: Every day | ORAL | Status: DC

## 2012-01-31 NOTE — ED Notes (Addendum)
Per FLOW manager: pt is a direct admit, admitted by Dr. Mort Sawyers, pending bed assignment. Per Dr. Lovell Sheehan, "nothing else needs to be done in ED, pt may go directly upstairs".

## 2012-01-31 NOTE — ED Notes (Signed)
Receiving RN notified of pending arrival of pt and events of last hour in triage/w/r.

## 2012-01-31 NOTE — H&P (Addendum)
DATE OF ADMISSION:  01/31/2012  PCP:    Thomos Lemons, DO, DO   Chief Complaint:  Chest Pain   HPI: [name] [name] is an 36 y.o. male with complaint of chest pain for the past 2 days.  The pain was substernal and sharp and lasted a few minutes.   He denies having SOB, nausea or vomiting or SOB associated with the pain.  He is a non-smoker and denies having a family history of CAD , but he  does have a history of hyperlipidemia and is on medications.       Past Medical History  Diagnosis Date  . Hyperlipidemia   . Fatty liver     history of fatty liver  . Abnormal LFTs (liver function tests)     secondary to lovastatin and fatty liver    History reviewed. No pertinent past surgical history.  Medications:  HOME MEDS: Prior to Admission medications   Medication Sig Start Date End Date Taking? Authorizing Provider  aspirin EC 81 MG tablet Take 81 mg by mouth daily.   Yes Historical Provider, MD  Multiple Vitamin (MULTIVITAMIN) tablet Take 1 tablet by mouth daily.     Yes Historical Provider, MD  Omega-3 Fatty Acids (FISH OIL) 1000 MG CAPS Take 2,000 mg by mouth daily.    Yes Historical Provider, MD  Pitavastatin Calcium 1 MG TABS Take 1 mg by mouth at bedtime. 11/29/11  Yes Thomos Lemons, DO    Allergies:  No Known Allergies  Social History:   reports that he has never smoked. He has never used smokeless tobacco. He reports that he does not drink alcohol or use illicit drugs.  Family History: Family History  Problem Relation Age of Onset  . Diabetes Mother   . Hypertension Mother   . Stroke Maternal Grandfather   . Coronary artery disease Maternal Uncle   . Other Neg Hx     no FH of colon cancer    Review of Systems:  The patient denies anorexia, fever, weight loss,, vision loss, decreased hearing, hoarseness, chest pain, syncope, dyspnea on exertion, peripheral edema, balance deficits, hemoptysis, abdominal pain, melena, hematochezia, severe indigestion/heartburn,  hematuria, incontinence, genital sores, muscle weakness, suspicious skin lesions, transient blindness, difficulty walking, depression, unusual weight change, abnormal bleeding, enlarged lymph nodes, angioedema, and breast masses.   Physical Exam:  GEN:  Pleasant  36 year old well nourished well developed male examined  and in no acute distress; cooperative with exam Filed Vitals:   01/31/12 1933  BP: 152/90  Pulse: 75  Temp: 98.3 F (36.8 C)  TempSrc: Oral  Resp: 18  SpO2: 96%   Blood pressure 152/90, pulse 75, temperature 98.3 F (36.8 C), temperature source Oral, resp. rate 18, SpO2 96.00%. PSYCH: He is alert and oriented x4; does not appear anxious does not appear depressed; affect is normal HEENT: Normocephalic and Atraumatic, Mucous membranes pink; PERRLA; EOM intact; Fundi:  Benign;  No scleral icterus, Nares: Patent, Oropharynx: Clear,Fair Dentition, Neck:  FROM, no cervical lymphadenopathy nor thyromegaly or carotid bruit; no JVD; Breasts:: Not examined CHEST WALL: No tenderness CHEST: Normal respiration, clear to auscultation bilaterally HEART: Regular rate and rhythm; no murmurs rubs or gallops BACK: No kyphosis or scoliosis; no CVA tenderness ABDOMEN: Positive Bowel Sounds, soft non-tender; no masses, no organomegaly. Rectal Exam: Not done EXTREMITIES: No cyanosis, clubbing or edema; no ulcerations. Genitalia: not examined PULSES: 2+ and symmetric SKIN: Normal hydration no rash or ulceration CNS: Cranial nerves 2-12 grossly intact no  focal neurologic deficit   Labs & Imaging   Admission labs ordered CBC, C-Met, and Cardiac Panel.   EKG: Normal Sinus Rhythm, No Acute ST segment changes, diffuse artifact seen,      Assessment/Plan: 1.  Chest Pain-  Admitted to 23 hour Observation for Rule Out. Cardiac Enzymes O2, ASA and Nitropaste ordered.    2.  Hyperlipidemia- Continue same meds, check fasting lipids.   3.  Other plans as per orders.    CODE STATUS:      FULL  CODE       Beulah Capobianco C 01/31/2012, 8:59 PM

## 2012-01-31 NOTE — ED Notes (Signed)
Pt c/o mid upper chest pain, onset last pm st's pain is a sharp shooting pain that comes and goes.  Pt denies any shortness of breath, nausea or diaphoresis.  Pt st's raising his arms above his head causes pain

## 2012-02-01 ENCOUNTER — Inpatient Hospital Stay (HOSPITAL_COMMUNITY): Payer: Managed Care, Other (non HMO)

## 2012-02-01 LAB — COMPREHENSIVE METABOLIC PANEL
ALT: 82 U/L — ABNORMAL HIGH (ref 0–53)
AST: 34 U/L (ref 0–37)
Albumin: 4.2 g/dL (ref 3.5–5.2)
CO2: 24 mEq/L (ref 19–32)
Calcium: 9.8 mg/dL (ref 8.4–10.5)
Chloride: 103 mEq/L (ref 96–112)
Creatinine, Ser: 0.8 mg/dL (ref 0.50–1.35)
GFR calc non Af Amer: >90 mL/min (ref >90)
Sodium: 138 mEq/L (ref 135–145)

## 2012-02-01 LAB — CBC
HCT: 40.9 % (ref 39.0–52.0)
Hemoglobin: 14.7 g/dL (ref 13.0–17.0)
MCH: 31.2 pg (ref 26.0–34.0)
MCHC: 35.9 g/dL (ref 30.0–36.0)
MCV: 85.7 fL (ref 78.0–100.0)
Platelets: 191 10*3/uL (ref 150–400)
RBC: 4.97 MIL/uL (ref 4.22–5.81)
RDW: 12.3 % (ref 11.5–15.5)
RDW: 12.2 % (ref 11.5–15.5)
WBC: 7.3 10*3/uL (ref 4.0–10.5)

## 2012-02-01 LAB — BASIC METABOLIC PANEL
BUN: 11 mg/dL (ref 6–23)
CO2: 26 mEq/L (ref 19–32)
Chloride: 106 mEq/L (ref 96–112)
Creatinine, Ser: 0.99 mg/dL (ref 0.50–1.35)
Glucose, Bld: 108 mg/dL — ABNORMAL HIGH (ref 70–99)

## 2012-02-01 LAB — LIPID PANEL
Cholesterol: 156 mg/dL (ref 0–200)
Total CHOL/HDL Ratio: 5.4 RATIO
Triglycerides: 297 mg/dL — ABNORMAL HIGH (ref <150)
VLDL: 59 mg/dL — ABNORMAL HIGH (ref 0–40)

## 2012-02-01 LAB — CARDIAC PANEL(CRET KIN+CKTOT+MB+TROPI)
CK, MB: 2.6 ng/mL (ref 0.3–4.0)
Total CK: 166 U/L (ref 7–232)
Troponin I: <0.3 ng/mL (ref <0.30)
Troponin I: <0.3 ng/mL (ref <0.30)

## 2012-02-01 MED ORDER — OMEPRAZOLE MAGNESIUM 20 MG PO TBEC: 20.0000 mg | DELAYED_RELEASE_TABLET | Freq: Every day | ORAL | Status: DC

## 2012-02-01 NOTE — Discharge Summary (Signed)
PATIENT DETAILS Name: [name] [name] Age: 36 y.o. Sex: male Date of Birth: 1976/05/13 MRN: [id number]. Admit Date: 01/31/2012 Admitting Physician: Ron Parker, MD WUJ:[id number] Artist Pais, DO, DO  PRIMARY DISCHARGE DIAGNOSIS:  Active Problems: Chest Pain Dyslipidemia     PAST MEDICAL HISTORY: Past Medical History  Diagnosis Date  . Hyperlipidemia   . Fatty liver     history of fatty liver  . Abnormal LFTs (liver function tests)     secondary to lovastatin and fatty liver    DISCHARGE MEDICATIONS: Medication List  As of 02/01/2012  2:11 PM   TAKE these medications         aspirin EC 81 MG tablet   Take 81 mg by mouth daily.      Fish Oil 1000 MG Caps   Take 2,000 mg by mouth daily.      multivitamin tablet   Take 1 tablet by mouth daily.      omeprazole 20 MG tablet   Commonly known as: PRILOSEC OTC   Take 1 tablet (20 mg total) by mouth daily.      Pitavastatin Calcium 1 MG Tabs   Take 1 mg by mouth at bedtime.             BRIEF HPI:  See H&P, Labs, Consult and Test reports for all details in brief, patient was admitted for chest pain. For further details please see the H&P done on admission.  CONSULTATIONS:   None  PERTINENT RADIOLOGIC STUDIES: Dg Chest 2 View  02/01/2012  *RADIOLOGY REPORT*  Clinical Data: Chest pain  CHEST - 2 VIEW  Comparison: None.  Findings: The cardiac silhouette, mediastinum, pulmonary vasculature are within normal limits.  Both lungs are clear. There is no acute bony abnormality.  IMPRESSION: There is no evidence of acute cardiac or pulmonary process.  Original Report Authenticated By: Brandon Melnick, M.D.     PERTINENT LAB RESULTS: CBC:  Basename 02/01/12 0455 02/01/12 0034  WBC 7.1 7.3  HGB 14.7 15.5  HCT 40.9 42.1  PLT 173 191   CMET CMP     Component Value Date/Time   NA 139 02/01/2012 0455   K 3.6 02/01/2012 0455   CL 106 02/01/2012 0455   CO2 26 02/01/2012 0455   GLUCOSE 108* 02/01/2012 0455   BUN 11 02/01/2012 0455   CREATININE 0.99 02/01/2012 0455   CALCIUM 9.2 02/01/2012 0455   PROT 7.1 02/01/2012 0034   ALBUMIN 4.2 02/01/2012 0034   AST 34 02/01/2012 0034   ALT 82* 02/01/2012 0034   ALKPHOS 47 02/01/2012 0034   BILITOT 0.6 02/01/2012 0034   GFRNONAA >90 02/01/2012 0455   GFRAA >90 02/01/2012 0455    GFR Estimated Creatinine Clearance: 122.6 ml/min (by C-G formula based on Cr of 0.99). No results found for this basename: LIPASE:2,AMYLASE:2 in the last 72 hours  Basename 02/01/12 1244 02/01/12 0455 01/31/12 2106  CKTOTAL 154 166 212  CKMB 2.5 2.6 3.2  CKMBINDEX -- -- --  TROPONINI <0.30 <0.30 <0.30   No components found with this basename: POCBNP:3 No results found for this basename: DDIMER:2 in the last 72 hours No results found for this basename: HGBA1C:2 in the last 72 hours  Basename 02/01/12 0455  CHOL 156  HDL 29*  LDLCALC 68  TRIG 782*  CHOLHDL 5.4  LDLDIRECT --   No results found for this basename: TSH,T4TOTAL,FREET3,T3FREE,THYROIDAB in the last 72 hours No results found for this basename: VITAMINB12:2,FOLATE:2,FERRITIN:2,TIBC:2,IRON:2,RETICCTPCT:2 in the last 72 hours Coags: No  results found for this basename: PT:2,INR:2 in the last 72 hours Microbiology: No results found for this or any previous visit (from the past 240 hour(s)).   BRIEF HOSPITAL COURSE:  Active Problems:  Chest Pain -likely atypical, has resolved and has not recurred since admission -no SOB or palpitations as well. -cardiac enzymes X 3 negative -EKG and CXR-normal -will d/c home, [facility] to call patient next week for outpatient appointment (Spoke with Theodore Demark PA-C).  Dyslipidemia -continue with Livalo -LDL 68   TODAY-DAY OF DISCHARGE:  Subjective:   [name] [name] today has no headache,no chest abdominal pain,no new weakness tingling or numbness, feels much better wants to go home today. He has not had any chest pain since admission. He denies any palpitations or Shortness of breath as  well.  Objective:   Blood pressure 119/71, pulse 68, temperature 97 F (36.1 C), temperature source Oral, resp. rate 14, height 5\' 11"  (1.803 m), weight 97.1 kg (214 lb 1.1 oz), SpO2 94.00%. No intake or output data in the 24 hours ending 02/01/12 1411  Exam Awake Alert, Oriented *3, No new F.N deficits, Normal affect [place].AT,PERRAL Supple Neck,No JVD, No cervical lymphadenopathy appriciated.  Symmetrical Chest wall movement, Good air movement bilaterally, CTAB RRR,No Gallops,Rubs or new Murmurs, No Parasternal Heave +ve B.Sounds, Abd Soft, Non tender, No organomegaly appriciated, No rebound -guarding or rigidity. No Cyanosis, Clubbing or edema, No new Rash or bruise  DISPOSITION: Home  DISCHARGE INSTRUCTIONS:    Follow-up Information    Follow up with Thomos Lemons, DO. Schedule an appointment as soon as possible for a visit in 1 week.      Follow up with [facility] (expect phone call for appointment next week)    Contact information:   [address] [place] [address]         Total Time spent on discharge equals 45 minutes.  SignedJeoffrey Massed 02/01/2012 2:11 PM

## 2012-02-03 NOTE — Progress Notes (Signed)
Utilization Review Completed.Kymberlie Brazeau T2/03/2012   

## 2012-02-12 ENCOUNTER — Encounter: Payer: Managed Care, Other (non HMO) | Admitting: Physician Assistant

## 2012-02-18 ENCOUNTER — Ambulatory Visit (INDEPENDENT_AMBULATORY_CARE_PROVIDER_SITE_OTHER): Payer: Managed Care, Other (non HMO) | Admitting: Cardiovascular Disease

## 2012-02-18 ENCOUNTER — Encounter: Payer: Self-pay | Admitting: Cardiovascular Disease

## 2012-02-18 DIAGNOSIS — R079 Chest pain, unspecified: Secondary | ICD-10-CM | POA: Insufficient documentation

## 2012-02-18 NOTE — Assessment & Plan Note (Signed)
[name] [name] presents with a recent episode of chest pain which prompted hospitalization.  He has pre-diabetes - high triglycerides and borderline elevated glucose levels.   We will get a stress myoview for further eval.   I will see him in several months.

## 2012-02-18 NOTE — Patient Instructions (Signed)
Your physician recommends that you schedule a follow-up appointment in: 2 months Your physician has requested that you have en exercise stress myoview. For further information please visit [email or web address] Please follow instruction sheet, as given.

## 2012-02-18 NOTE — Progress Notes (Signed)
    [name] [name] Date of Birth  Sep 19, 1976 [facility]     [place]  [address]    [address]   [address] [place], [place]  [address]    [place], [place]  [address] [phone]  Fax  [phone]  [phone]  Fax [phone]  1. Chest pain  History of Present Illness:  [name] [name] is a 36 yo who we are seeing for chest pain.  He was admitted overnight and ruled out for MI.  ECG was normal.  He gets regular exercise. He does IT work.   Current Outpatient Prescriptions on File Prior to Visit  Medication Sig Dispense Refill  . aspirin EC 81 MG tablet Take 81 mg by mouth daily.      . Multiple Vitamin (MULTIVITAMIN) tablet Take 1 tablet by mouth daily.        . Omega-3 Fatty Acids (FISH OIL) 1000 MG CAPS Take 2,000 mg by mouth daily.       Marland Kitchen omeprazole (PRILOSEC OTC) 20 MG tablet Take 1 tablet (20 mg total) by mouth daily.  30 tablet    . Pitavastatin Calcium 1 MG TABS Take 1 mg by mouth at bedtime.        No Known Allergies  Past Medical History  Diagnosis Date  . Hyperlipidemia   . Fatty liver     history of fatty liver  . Abnormal LFTs (liver function tests)     secondary to lovastatin and fatty liver    No past surgical history on file.  History  Smoking status  . Never Smoker   Smokeless tobacco  . Never Used    History  Alcohol Use No    Family History  Problem Relation Age of Onset  . Diabetes Mother   . Hypertension Mother   . Stroke Maternal Grandfather   . Coronary artery disease Maternal Uncle   . Other Neg Hx     no FH of colon cancer    Reviw of Systems:  Reviewed in the HPI.  All other systems are negative.  Physical Exam: Blood pressure 152/98, pulse 83, height 5\' 11"  (1.803 m), weight 216 lb 12.8 oz (98.34 kg). General: Well developed, well nourished, in no acute distress.  Head: Normocephalic, atraumatic, sclera non-icteric, mucus membranes are moist,   Neck: Supple. Negative for carotid bruits. JVD not  elevated.  Lungs: Clear bilaterally to auscultation without wheezes, rales, or rhonchi. Breathing is unlabored.  Heart: RRR with S1 S2. No murmurs, rubs, or gallops appreciated.  Abdomen: Soft, non-tender, non-distended with normoactive bowel sounds. No hepatomegaly. No rebound/guarding. No obvious abdominal masses.  Msk:  Strength and tone appear normal for age.  Extremities: No clubbing or cyanosis. No edema.  Distal pedal pulses are 2+ and equal bilaterally.  Neuro: Alert and oriented X 3. Moves all extremities spontaneously.  Psych:  Responds to questions appropriately with a normal affect.  ECG:  Assessment / Plan:

## 2012-02-25 ENCOUNTER — Telehealth: Payer: Self-pay | Admitting: *Deleted

## 2012-02-25 NOTE — Telephone Encounter (Signed)
Order placed for exercise tolerance test instead of myoview due to insurance, see prior notes.

## 2012-02-25 NOTE — Telephone Encounter (Signed)
Patient scheduled for treadmill 03/04/12 2 11:30.

## 2012-02-27 ENCOUNTER — Encounter (HOSPITAL_COMMUNITY): Payer: Managed Care, Other (non HMO)

## 2012-03-02 ENCOUNTER — Other Ambulatory Visit: Payer: Self-pay | Admitting: *Deleted

## 2012-03-02 MED ORDER — PITAVASTATIN CALCIUM 2 MG PO TABS: ORAL_TABLET | ORAL | Status: DC

## 2012-03-02 NOTE — Telephone Encounter (Signed)
Yes. Fill 2mg 

## 2012-03-02 NOTE — Telephone Encounter (Signed)
Patients wife called and left voice message requesting a refill on Livalo. She stated that she has contacted [place] mail order for the refill, and states she was also advised to contact the office. Her message states the medication is expensive and she would like to know if Dr Rodena Medin would approve a 2 mg dose to take 1/2 tablet at bedtime.

## 2012-03-02 NOTE — Telephone Encounter (Signed)
Rx dose change to 2 mg 1/2 tablet at bedtime. Rx sent to [facility] order.

## 2012-03-04 ENCOUNTER — Encounter: Payer: Managed Care, Other (non HMO) | Admitting: Nurse Practitioner

## 2012-03-06 ENCOUNTER — Encounter: Payer: Self-pay | Admitting: Internal Medicine

## 2012-03-06 ENCOUNTER — Ambulatory Visit (INDEPENDENT_AMBULATORY_CARE_PROVIDER_SITE_OTHER): Payer: Managed Care, Other (non HMO) | Admitting: Internal Medicine

## 2012-03-06 VITALS — BP 120/90 | HR 73 | Temp 98.3°F | Resp 18 | Wt 213.0 lb

## 2012-03-06 DIAGNOSIS — J329 Chronic sinusitis, unspecified: Secondary | ICD-10-CM

## 2012-03-06 MED ORDER — AZITHROMYCIN 250 MG PO TABS: ORAL_TABLET | ORAL | Status: AC

## 2012-03-07 DIAGNOSIS — J329 Chronic sinusitis, unspecified: Secondary | ICD-10-CM | POA: Insufficient documentation

## 2012-03-07 NOTE — Assessment & Plan Note (Signed)
Given abx to hold. Begin abx if sx's do not improve after total duration of 8-10 days. Followup if no improvement or worsening.  

## 2012-03-07 NOTE — Progress Notes (Signed)
  Subjective:    Patient ID: [name] [name], male    DOB: 03-31-1976, 36 y.o.   MRN: [id number]  HPI Pt presents to clinic for evaluation of headache and nasal drainage. Notes 5-6 d h/o nasal drainage, frontal headache and pn cough. No fever or chills. Taking otc medication with mild improvement. No other alleviating or exacerbating factors. No other complaints.  Past Medical History  Diagnosis Date  . Hyperlipidemia   . Fatty liver     history of fatty liver  . Abnormal LFTs (liver function tests)     secondary to lovastatin and fatty liver   No past surgical history on file.  reports that he has never smoked. He has never used smokeless tobacco. He reports that he does not drink alcohol or use illicit drugs. family history includes Coronary artery disease in his maternal uncle; Diabetes in his mother; Hypertension in his mother; and Stroke in his maternal grandfather.  There is no history of Other. No Known Allergies   Review of Systems see hpi     Objective:   Physical Exam  Nursing note and vitals reviewed. Constitutional: He appears well-developed and well-nourished. No distress.  HENT:  Head: Normocephalic and atraumatic.  Right Ear: Tympanic membrane, external ear and ear canal normal.  Left Ear: Tympanic membrane, external ear and ear canal normal.  Nose: Nose normal.  Mouth/Throat: Oropharynx is clear and moist. No oropharyngeal exudate.  Eyes: Conjunctivae are normal. Right eye exhibits no discharge. Left eye exhibits no discharge. No scleral icterus.  Neck: Neck supple.  Pulmonary/Chest: Effort normal and breath sounds normal. No respiratory distress. He has no wheezes. He has no rales.  Neurological: He is alert.  Skin: He is not diaphoretic.  Psychiatric: He has a normal mood and affect.          Assessment & Plan:

## 2012-03-11 ENCOUNTER — Ambulatory Visit (INDEPENDENT_AMBULATORY_CARE_PROVIDER_SITE_OTHER): Payer: Managed Care, Other (non HMO) | Admitting: Physician Assistant

## 2012-03-11 DIAGNOSIS — R079 Chest pain, unspecified: Secondary | ICD-10-CM

## 2012-03-11 NOTE — Progress Notes (Signed)
Exercise Treadmill Test  Pre-Exercise Testing Evaluation Rhythm: normal sinus  Rate: 93   PR:  .13 QRS:  .09  QT:  .36 QTc: .45     Test  Exercise Tolerance Test Ordering MD: Leodis Sias , MD  Interpreting MD:  Tereso Newcomer PA-C  Unique Test No: 1  Treadmill:  1  Indication for ETT: chest pain - rule out ischemia  Contraindication to ETT: No   Stress Modality: exercise - treadmill  Cardiac Imaging Performed: non   Protocol: standard Bruce - maximal  Max BP: 240/93  Max MPHR (bpm):  184 85% MPR (bpm):  156  MPHR obtained (bpm): 173 % MPHR obtained:  94%  Reached 85% MPHR (min:sec):  6:30 Total Exercise Time (min-sec):  7:28  Workload in METS:  9.2 Borg Scale: 15  Reason ETT Terminated:  exaggerated hypertensive response    ST Segment Analysis At Rest: normal ST segments - no evidence of significant ST depression With Exercise: no evidence of significant ST depression  Other Information Arrhythmia:  No Angina during ETT:  absent (0) Quality of ETT:  diagnostic  ETT Interpretation:  normal - no evidence of ischemia by ST analysis  Comments: Good exercise tolerance. No chest pain. Hypertensive BP response to exercise. No ST-T changes to suggest ischemia.   Recommendations: Follow up with Dr. Delane Ginger as directed. Follow up with PCP for BP management. Tereso Newcomer, PA-C  9:32 AM 03/11/2012

## 2012-03-13 ENCOUNTER — Telehealth: Payer: Self-pay | Admitting: Internal Medicine

## 2012-03-13 ENCOUNTER — Encounter: Payer: Self-pay | Admitting: Internal Medicine

## 2012-03-13 ENCOUNTER — Ambulatory Visit (INDEPENDENT_AMBULATORY_CARE_PROVIDER_SITE_OTHER): Payer: Managed Care, Other (non HMO) | Admitting: Internal Medicine

## 2012-03-13 VITALS — BP 112/80 | HR 74 | Temp 98.1°F | Resp 16 | Ht 69.25 in | Wt 212.0 lb

## 2012-03-13 DIAGNOSIS — R945 Abnormal results of liver function studies: Secondary | ICD-10-CM

## 2012-03-13 DIAGNOSIS — R739 Hyperglycemia, unspecified: Secondary | ICD-10-CM | POA: Insufficient documentation

## 2012-03-13 DIAGNOSIS — I10 Essential (primary) hypertension: Secondary | ICD-10-CM | POA: Insufficient documentation

## 2012-03-13 DIAGNOSIS — R7309 Other abnormal glucose: Secondary | ICD-10-CM

## 2012-03-13 DIAGNOSIS — E785 Hyperlipidemia, unspecified: Secondary | ICD-10-CM

## 2012-03-13 DIAGNOSIS — R030 Elevated blood-pressure reading, without diagnosis of hypertension: Secondary | ICD-10-CM

## 2012-03-13 NOTE — Assessment & Plan Note (Signed)
Recommend low sugar/carb diet, regular aerobic exercise and wt loss. Obtain chem7 and a1c prior to next visit

## 2012-03-13 NOTE — Patient Instructions (Signed)
Please schedule fasting labs prior to next appointment Chem7, a1c- hyperglycemia and lft-abn lft

## 2012-03-13 NOTE — Assessment & Plan Note (Signed)
Good control maintained on statin tx.

## 2012-03-13 NOTE — Telephone Encounter (Signed)
Labs entered for June 2013.

## 2012-03-13 NOTE — Progress Notes (Signed)
  Subjective:    Patient ID: [name] [name], male    DOB: [birth year], 36 y.o.   MRN: [id number]  HPI Pt presents to clinic for followup of multiple medical problems. Recent seen in ED with CP and completed outpt EST felt to be nl. Did have hypertensive response to exercise. Resting bp reviewed as nl. Recent uri resolved without need for abx. Lipid profile reviewed under good control. ALT mildly elevated and stable. Weight essentially unchanged from last visit. ED labwork showed mild hyperglycemia.  Past Medical History  Diagnosis Date  . Hyperlipidemia   . Fatty liver     history of fatty liver  . Abnormal LFTs (liver function tests)     secondary to lovastatin and fatty liver   No past surgical history on file.  reports that he has never smoked. He has never used smokeless tobacco. He reports that he does not drink alcohol or use illicit drugs. family history includes Coronary artery disease in his maternal uncle; Diabetes in his mother; Hypertension in his mother; and Stroke in his maternal grandfather.  There is no history of Other. No Known Allergies    Review of Systems see hpi     Objective:   Physical Exam  Physical Exam  Nursing note and vitals reviewed. Constitutional: Appears well-developed and well-nourished. No distress.  HENT:  Head: Normocephalic and atraumatic.  Right Ear: External ear normal.  Left Ear: External ear normal.  Eyes: Conjunctivae are normal. No scleral icterus.  Neck: Neck supple. Carotid bruit is not present.  Cardiovascular: Normal rate, regular rhythm and normal heart sounds.  Exam reveals no gallop and no friction rub.   No murmur heard. Pulmonary/Chest: Effort normal and breath sounds normal. No respiratory distress. He has no wheezes. no rales.  Lymphadenopathy:    He has no cervical adenopathy.  Neurological:Alert.  Skin: Skin is warm and dry. Not diaphoretic.  Psychiatric: Has a normal mood and affect.         Assessment & Plan:

## 2012-03-13 NOTE — Assessment & Plan Note (Signed)
Suspect contribution from fatty liver. lft elevation mild and stable on statin without worsening. Recommend wt loss of 5-8lbs before next visit and repeat lft's at that time.

## 2012-03-13 NOTE — Assessment & Plan Note (Signed)
Normotensive at rest. Elevated with exercise. Recommend outpt bp log to be submitted for review. Regular aerobic exercise and wt loss.

## 2012-03-30 ENCOUNTER — Telehealth: Payer: Self-pay | Admitting: *Deleted

## 2012-03-30 NOTE — Telephone Encounter (Signed)
Patient wife called and left voice message stating patient blood pressure readings were checked at home, they have been running normal.   3/15 126/76  3/22 108/60   3/29 112/70

## 2012-04-16 ENCOUNTER — Ambulatory Visit (INDEPENDENT_AMBULATORY_CARE_PROVIDER_SITE_OTHER): Payer: Managed Care, Other (non HMO) | Admitting: Cardiovascular Disease

## 2012-04-16 ENCOUNTER — Encounter: Payer: Self-pay | Admitting: Cardiovascular Disease

## 2012-04-16 VITALS — BP 133/92 | HR 57 | Ht 69.0 in | Wt 213.0 lb

## 2012-04-16 DIAGNOSIS — R030 Elevated blood-pressure reading, without diagnosis of hypertension: Secondary | ICD-10-CM

## 2012-04-16 DIAGNOSIS — R079 Chest pain, unspecified: Secondary | ICD-10-CM

## 2012-04-16 NOTE — Progress Notes (Signed)
     [name] [name] Date of Birth  08-Mar-1976 [facility]     [place]  [address]    [address]   [address] [place], [place]  [address]    [place], [place]  [address] [phone]  Fax  [phone]  [phone]  Fax [phone]  Problems:  1. Chest pain- negative stress test Feb., 2013 2. Mild hypertension  History of Present Illness:  [name] [name] is a 36 yo who we are seeing for chest pain.  He was admitted overnight and ruled out for MI.    We originally had one to schedule him for a stress Myoview study but his insurance, he would not approve it. He did a stress test which was negative. He did have a hypertensive response to exercise.  He continues to have mildly elevated blood pressure. He's been taking that at home. He's not had any further episodes of chest pain.  Current Outpatient Prescriptions on File Prior to Visit  Medication Sig Dispense Refill  . aspirin EC 81 MG tablet Take 81 mg by mouth daily.      . Multiple Vitamin (MULTIVITAMIN) tablet Take 1 tablet by mouth daily.        . Omega-3 Fatty Acids (FISH OIL) 1000 MG CAPS Take 2,000 mg by mouth daily.       . Pitavastatin Calcium 2 MG TABS 1/2 tablet by mouth daily at bedtime  90 tablet  0    No Known Allergies  Past Medical History  Diagnosis Date  . Hyperlipidemia   . Fatty liver     history of fatty liver  . Abnormal LFTs (liver function tests)     secondary to lovastatin and fatty liver    No past surgical history on file.  History  Smoking status  . Never Smoker   Smokeless tobacco  . Never Used    History  Alcohol Use No    Family History  Problem Relation Age of Onset  . Diabetes Mother   . Hypertension Mother   . Stroke Maternal Grandfather   . Coronary artery disease Maternal Uncle   . Other Neg Hx     no FH of colon cancer    Reviw of Systems:  Reviewed in the HPI.  All other systems are negative.  Physical Exam: Blood pressure 133/92, pulse 57,  height 5\' 9"  (1.753 m), weight 213 lb (96.616 kg). General: Well developed, well nourished, in no acute distress.  Head: Normocephalic, atraumatic, sclera non-icteric, mucus membranes are moist,   Neck: Supple. Negative for carotid bruits. JVD not elevated.  Lungs: Clear bilaterally to auscultation without wheezes, rales, or rhonchi. Breathing is unlabored.  Heart: RRR with S1 S2. No murmurs, rubs, or gallops appreciated.  Abdomen: Soft, non-tender, non-distended with normoactive bowel sounds. No hepatomegaly. No rebound/guarding. No obvious abdominal masses.  Msk:  Strength and tone appear normal for age.  Extremities: No clubbing or cyanosis. No edema.  Distal pedal pulses are 2+ and equal bilaterally.  Neuro: Alert and oriented X 3. Moves all extremities spontaneously.  Psych:  Responds to questions appropriately with a normal affect.  ECG:  Assessment / Plan:

## 2012-04-16 NOTE — Assessment & Plan Note (Addendum)
His blood pressure still mildly elevated. He would like to followup with his general medical Dr. will. I've asked him to watch his salt intake.  I'll be happy to see him on an as-needed basis.

## 2012-04-16 NOTE — Patient Instructions (Addendum)
Your physician recommends that you schedule a follow-up appointment in: AS NEEDED BASIS  

## 2012-04-16 NOTE — Assessment & Plan Note (Signed)
He's not had any further episodes of chest pain. He had a negative stress test. I'll be happy to see him on as-needed basis.

## 2012-06-09 ENCOUNTER — Ambulatory Visit: Payer: Managed Care, Other (non HMO) | Admitting: Internal Medicine

## 2012-07-03 ENCOUNTER — Other Ambulatory Visit: Payer: Self-pay | Admitting: *Deleted

## 2012-07-03 DIAGNOSIS — R945 Abnormal results of liver function studies: Secondary | ICD-10-CM

## 2012-07-03 DIAGNOSIS — E785 Hyperlipidemia, unspecified: Secondary | ICD-10-CM

## 2012-07-03 DIAGNOSIS — R739 Hyperglycemia, unspecified: Secondary | ICD-10-CM

## 2012-07-03 LAB — HEMOGLOBIN A1C
Hgb A1c MFr Bld: 5.6 % (ref <5.7)
Mean Plasma Glucose: 114 mg/dL (ref <117)

## 2012-07-03 NOTE — Progress Notes (Signed)
Per OV note 03.15.13/SLS Released Cardiology lab orders by accident along with Dr. Ty Hilts, called Cardiology & informed; let [facility] lab tech know, everything Ok/SLS

## 2012-07-04 LAB — HEPATIC FUNCTION PANEL
Alkaline Phosphatase: 41 U/L (ref 39–117)
Bilirubin, Direct: 0.2 mg/dL (ref 0.0–0.3)
Indirect Bilirubin: 0.5 mg/dL (ref 0.0–0.9)
Total Protein: 6.9 g/dL (ref 6.0–8.3)

## 2012-07-04 LAB — BASIC METABOLIC PANEL
BUN: 12 mg/dL (ref 6–23)
Chloride: 105 mEq/L (ref 96–112)
Potassium: 4.3 mEq/L (ref 3.5–5.3)
Sodium: 140 mEq/L (ref 135–145)

## 2012-07-10 ENCOUNTER — Ambulatory Visit (INDEPENDENT_AMBULATORY_CARE_PROVIDER_SITE_OTHER): Payer: Managed Care, Other (non HMO) | Admitting: Internal Medicine

## 2012-07-10 ENCOUNTER — Encounter: Payer: Self-pay | Admitting: Internal Medicine

## 2012-07-10 VITALS — BP 149/94 | HR 75 | Temp 98.2°F | Resp 16 | Wt 210.0 lb

## 2012-07-10 DIAGNOSIS — N509 Disorder of male genital organs, unspecified: Secondary | ICD-10-CM

## 2012-07-10 DIAGNOSIS — E785 Hyperlipidemia, unspecified: Secondary | ICD-10-CM

## 2012-07-10 DIAGNOSIS — R030 Elevated blood-pressure reading, without diagnosis of hypertension: Secondary | ICD-10-CM

## 2012-07-10 DIAGNOSIS — N50811 Right testicular pain: Secondary | ICD-10-CM

## 2012-07-10 NOTE — Patient Instructions (Signed)
Please schedule fasting labs prior to next appointment Lipid/lft-272.4 

## 2012-07-12 DIAGNOSIS — N50811 Right testicular pain: Secondary | ICD-10-CM | POA: Insufficient documentation

## 2012-07-12 NOTE — Assessment & Plan Note (Signed)
Nl exam. Appears to be positional

## 2012-07-12 NOTE — Assessment & Plan Note (Signed)
Recommend bp log to be submitted for review

## 2012-07-12 NOTE — Assessment & Plan Note (Signed)
Minimal elevation. Discussed control of cholesterol and weight

## 2012-07-12 NOTE — Progress Notes (Signed)
  Subjective:    Patient ID: [name] [name], male    DOB: 02-Jun-1976, 36 y.o.   MRN: [id number]  HPI Pt presents to clinic for followup of multiple medical problems. BP elevated but typically normotensive. H/o fatty liver and reviewed only minimal elevation of single lft. Notes intermittent right testicle pain but it is positional occuring after prolonged sitting and resolves with standing up. Denies urethral dc or urinary sx's.  Past Medical History  Diagnosis Date  . Hyperlipidemia   . Fatty liver     history of fatty liver  . Abnormal LFTs (liver function tests)     secondary to lovastatin and fatty liver   No past surgical history on file.  reports that he has never smoked. He has never used smokeless tobacco. He reports that he does not drink alcohol or use illicit drugs. family history includes Coronary artery disease in his maternal uncle; Diabetes in his mother; Hypertension in his mother; and Stroke in his maternal grandfather.  There is no history of Other. No Known Allergies    Review of Systems see hpi     Objective:   Physical Exam  Genitourinary: Testes normal and penis normal. Right testis shows no mass, no swelling and no tenderness. Right testis is descended. Left testis shows no mass, no swelling and no tenderness. Left testis is descended.    Physical Exam  Nursing note and vitals reviewed. Constitutional: Appears well-developed and well-nourished. No distress.  HENT:  Head: Normocephalic and atraumatic.  Right Ear: External ear normal.  Left Ear: External ear normal.  Eyes: Conjunctivae are normal. No scleral icterus.  Neck: Neck supple. Carotid bruit is not present.  Cardiovascular: Normal rate, regular rhythm and normal heart sounds.  Exam reveals no gallop and no friction rub.   No murmur heard. Pulmonary/Chest: Effort normal and breath sounds normal. No respiratory distress. He has no wheezes. no rales.  Lymphadenopathy:    He has no cervical  adenopathy.  Neurological:Alert.  Skin: Skin is warm and dry. Not diaphoretic.  Psychiatric: Has a normal mood and affect.        Assessment & Plan:

## 2012-08-19 ENCOUNTER — Telehealth: Payer: Self-pay | Admitting: Internal Medicine

## 2012-08-19 MED ORDER — PITAVASTATIN CALCIUM 2 MG PO TABS: ORAL_TABLET | ORAL | Status: DC

## 2012-08-19 NOTE — Telephone Encounter (Signed)
New prescription request  livalo 2mg  tablet

## 2012-08-19 NOTE — Telephone Encounter (Signed)
Sent refill back to [facility]..08/19/12@8 :55am/LMB

## 2012-08-28 ENCOUNTER — Other Ambulatory Visit: Payer: Self-pay | Admitting: *Deleted

## 2012-08-28 MED ORDER — PITAVASTATIN CALCIUM 2 MG PO TABS: ORAL_TABLET | ORAL | Status: DC

## 2012-08-28 NOTE — Progress Notes (Signed)
Per pt request, Rx sent to [facility] Order[facility] Fax: [phone]

## 2012-09-10 ENCOUNTER — Other Ambulatory Visit: Payer: Self-pay | Admitting: *Deleted

## 2012-09-10 MED ORDER — PITAVASTATIN CALCIUM 2 MG PO TABS: ORAL_TABLET | ORAL | Status: DC

## 2012-09-10 NOTE — Progress Notes (Signed)
Sent 2nd fax to [facility] requesting medication refill for pt; patient informed, will f/u with mail order pharmacy & samples given to patient/SLS

## 2013-01-12 NOTE — Addendum Note (Signed)
Addended by: Regis Bill on: 01/12/2013 04:31 PM   Modules accepted: Orders

## 2013-01-14 ENCOUNTER — Ambulatory Visit: Payer: Managed Care, Other (non HMO) | Admitting: Internal Medicine

## 2013-01-14 ENCOUNTER — Other Ambulatory Visit (INDEPENDENT_AMBULATORY_CARE_PROVIDER_SITE_OTHER): Payer: Managed Care, Other (non HMO)

## 2013-01-14 DIAGNOSIS — E785 Hyperlipidemia, unspecified: Secondary | ICD-10-CM

## 2013-01-14 LAB — LIPID PANEL
HDL: 31 mg/dL — ABNORMAL LOW (ref >39.00)
LDL Cholesterol: 91 mg/dL (ref 0–99)
Total CHOL/HDL Ratio: 5
Triglycerides: 126 mg/dL (ref 0.0–149.0)
VLDL: 25.2 mg/dL (ref 0.0–40.0)

## 2013-01-14 LAB — HEPATIC FUNCTION PANEL
AST: 31 U/L (ref 0–37)
Albumin: 4.3 g/dL (ref 3.5–5.2)
Total Bilirubin: 1 mg/dL (ref 0.3–1.2)

## 2013-01-18 ENCOUNTER — Ambulatory Visit: Payer: Managed Care, Other (non HMO) | Admitting: Family

## 2013-02-08 ENCOUNTER — Ambulatory Visit (INDEPENDENT_AMBULATORY_CARE_PROVIDER_SITE_OTHER): Payer: Managed Care, Other (non HMO) | Admitting: Family

## 2013-02-08 ENCOUNTER — Encounter: Payer: Self-pay | Admitting: Family

## 2013-02-08 VITALS — BP 150/92 | HR 73 | Temp 98.5°F | Resp 16 | Ht 69.25 in | Wt 215.1 lb

## 2013-02-08 DIAGNOSIS — I10 Essential (primary) hypertension: Secondary | ICD-10-CM

## 2013-02-08 DIAGNOSIS — E785 Hyperlipidemia, unspecified: Secondary | ICD-10-CM

## 2013-02-08 MED ORDER — HYDROCHLOROTHIAZIDE 25 MG PO TABS: 25.0000 mg | ORAL_TABLET | Freq: Every day | ORAL | Status: DC

## 2013-02-08 NOTE — Progress Notes (Signed)
  Subjective:    Patient ID: [name] [name], male    DOB: [birth year], 37 y.o.   MRN: [id number]  HPI  Mr. [name] [name] is a 37 yr old male who presents today for follow up.  1) Hyperlipidemia-  LDL is 91 on livalo. He denies myalgia.  2) HTN-  He is not currently maintained on bp meds.  3) blurred vision-occurred after prolonged visit to sauna. Pt reports vision has returned to normal.  Review of Systems     Past Medical History  Diagnosis Date  . Hyperlipidemia   . Fatty liver     history of fatty liver  . Abnormal LFTs (liver function tests)     secondary to lovastatin and fatty liver    History   Social History  . Marital Status: Married    Spouse Name: N/A    Number of Children: N/A  . Years of Education: N/A   Occupational History  . Not on file.   Social History Main Topics  . Smoking status: Never Smoker   . Smokeless tobacco: Never Used  . Alcohol Use: No  . Drug Use: No  . Sexually Active: Not on file   Other Topics Concern  . Not on file   Social History Narrative   Married   Never Smoked   Alcohol use-no (occasional) 1x/month 1-2 beers at a time...trying to reduce   Drug use-no    Regular exercise-yes     Occupation:  Programmer          No past surgical history on file.  Family History  Problem Relation Age of Onset  . Diabetes Mother   . Hypertension Mother   . Stroke Maternal Grandfather   . Coronary artery disease Maternal Uncle   . Other Neg Hx     no FH of colon cancer    No Known Allergies  Current Outpatient Prescriptions on File Prior to Visit  Medication Sig Dispense Refill  . aspirin EC 81 MG tablet Take 81 mg by mouth daily.      . Multiple Vitamin (MULTIVITAMIN) tablet Take 1 tablet by mouth daily.        . Omega-3 Fatty Acids (FISH OIL) 1000 MG CAPS Take 2,000 mg by mouth daily.       . Pitavastatin Calcium 2 MG TABS Take 1/2 tablet [1 mg] by mouth daily at bedtime  21 tablet  0   No current facility-administered  medications on file prior to visit.    BP 150/92  Pulse 73  Temp(Src) 98.5 F (36.9 C) (Oral)  Resp 16  Ht 5' 9.25" (1.759 m)  Wt 215 lb 1.9 oz (97.578 kg)  BMI 31.54 kg/m2  SpO2 97%    Objective:   Physical Exam  Constitutional: He appears well-developed and well-nourished.  Cardiovascular: Normal rate and regular rhythm.   No murmur heard. Pulmonary/Chest: Effort normal and breath sounds normal. No respiratory distress. He has no wheezes. He has no rales.  Musculoskeletal: He exhibits no edema.  Neurological: He is alert.  Psychiatric: He has a normal mood and affect. His behavior is normal. Judgment and thought content normal.          Assessment & Plan:   BP Readings from Last 3 Encounters:  02/08/13 150/92  07/10/12 149/94  04/16/12 133/92

## 2013-02-08 NOTE — Patient Instructions (Addendum)
Please follow up in 2 weeks.  Hypertension As your heart beats, it forces blood through your arteries. This force is your blood pressure. If the pressure is too high, it is called hypertension (HTN) or high blood pressure. HTN is dangerous because you may have it and not know it. High blood pressure may mean that your heart has to work harder to pump blood. Your arteries may be narrow or stiff. The extra work puts you at risk for heart disease, stroke, and other problems.  Blood pressure consists of two numbers, a higher number over a lower, 110/72, for example. It is stated as "110 over 72." The ideal is below 120 for the top number (systolic) and under 80 for the bottom (diastolic). Write down your blood pressure today. You should pay close attention to your blood pressure if you have certain conditions such as:  Heart failure.  Prior heart attack.  Diabetes  Chronic kidney disease.  Prior stroke.  Multiple risk factors for heart disease. To see if you have HTN, your blood pressure should be measured while you are seated with your arm held at the level of the heart. It should be measured at least twice. A one-time elevated blood pressure reading (especially in the Emergency Department) does not mean that you need treatment. There may be conditions in which the blood pressure is different between your right and left arms. It is important to see your caregiver soon for a recheck. Most people have essential hypertension which means that there is not a specific cause. This type of high blood pressure may be lowered by changing lifestyle factors such as:  Stress.  Smoking.  Lack of exercise.  Excessive weight.  Drug/tobacco/alcohol use.  Eating less salt. Most people do not have symptoms from high blood pressure until it has caused damage to the body. Effective treatment can often prevent, delay or reduce that damage. TREATMENT  When a cause has been identified, treatment for high  blood pressure is directed at the cause. There are a large number of medications to treat HTN. These fall into several categories, and your caregiver will help you select the medicines that are best for you. Medications may have side effects. You should review side effects with your caregiver. If your blood pressure stays high after you have made lifestyle changes or started on medicines,   Your medication(s) may need to be changed.  Other problems may need to be addressed.  Be certain you understand your prescriptions, and know how and when to take your medicine.  Be sure to follow up with your caregiver within the time frame advised (usually within two weeks) to have your blood pressure rechecked and to review your medications.  If you are taking more than one medicine to lower your blood pressure, make sure you know how and at what times they should be taken. Taking two medicines at the same time can result in blood pressure that is too low. SEEK IMMEDIATE MEDICAL CARE IF:  You develop a severe headache, blurred or changing vision, or confusion.  You have unusual weakness or numbness, or a faint feeling.  You have severe chest or abdominal pain, vomiting, or breathing problems. MAKE SURE YOU:   Understand these instructions.  Will watch your condition.  Will get help right away if you are not doing well or get worse. Document Released: 12/16/2005 Document Revised: 03/09/2012 Document Reviewed: 08/05/2008 [facility] Patient Information 2013 [place], [place].

## 2013-02-11 NOTE — Assessment & Plan Note (Signed)
ldl at goal. Alt remains mildly elevated but stable. Likely due to fatty liver dz. Cont. livalo.

## 2013-02-11 NOTE — Assessment & Plan Note (Signed)
Deteriorated. Plan to start hctz, discussed low sodium diet, exercise weight loss. Close follow up for bp check and bmet.

## 2013-02-13 ENCOUNTER — Other Ambulatory Visit: Payer: Self-pay

## 2013-03-09 ENCOUNTER — Telehealth: Payer: Self-pay | Admitting: *Deleted

## 2013-03-09 NOTE — Telephone Encounter (Signed)
Left message for patient to return my call.

## 2013-03-09 NOTE — Telephone Encounter (Signed)
Received fax from [facility] refill request for Livalo 2mg  1/2 tablet at bedtime. Request forwarded to Provider for signature. Pt was last seen 02/08/13 and advised f/u in 2 weeks for BP recheck and lab work. Please call pt to arrange f/u soon.

## 2013-03-09 NOTE — Telephone Encounter (Signed)
Signed.

## 2013-03-10 MED ORDER — PITAVASTATIN CALCIUM 2 MG PO TABS: ORAL_TABLET | ORAL | Status: DC

## 2013-03-10 NOTE — Telephone Encounter (Signed)
Patient returned phone call and scheduled an appointment for 03/18/13

## 2013-03-10 NOTE — Telephone Encounter (Signed)
Rx form signed and faxed back to [facility] at [phone].

## 2013-03-18 ENCOUNTER — Ambulatory Visit (INDEPENDENT_AMBULATORY_CARE_PROVIDER_SITE_OTHER): Payer: Managed Care, Other (non HMO) | Admitting: Family

## 2013-03-18 ENCOUNTER — Encounter: Payer: Self-pay | Admitting: Family

## 2013-03-18 VITALS — BP 120/90 | HR 73 | Temp 97.7°F | Resp 16 | Ht 69.25 in | Wt 218.1 lb

## 2013-03-18 DIAGNOSIS — I10 Essential (primary) hypertension: Secondary | ICD-10-CM

## 2013-03-18 LAB — BASIC METABOLIC PANEL
CO2: 28 mEq/L (ref 19–32)
Calcium: 9.5 mg/dL (ref 8.4–10.5)
Chloride: 105 mEq/L (ref 96–112)
Glucose, Bld: 105 mg/dL — ABNORMAL HIGH (ref 70–99)
Potassium: 4 mEq/L (ref 3.5–5.3)
Sodium: 140 mEq/L (ref 135–145)

## 2013-03-18 MED ORDER — HYDROCHLOROTHIAZIDE 25 MG PO TABS: 25.0000 mg | ORAL_TABLET | Freq: Every day | ORAL | Status: DC

## 2013-03-18 NOTE — Patient Instructions (Addendum)
Please complete lab work prior to leaving. Follow up in 3 months.  

## 2013-03-18 NOTE — Progress Notes (Signed)
  Subjective:    Patient ID: [name] [name], male    DOB: [birth year], 37 y.o.   MRN: [id number]  HPI  [name] [name] is a 37 yr old male who presents today for follow up of HTN.  He continues HCTZ but notes that he sometimes forgets to take. He denies CP/SOB or swelling.    Review of Systems    see HPI  Past Medical History  Diagnosis Date  . Hyperlipidemia   . Fatty liver     history of fatty liver  . Abnormal LFTs (liver function tests)     secondary to lovastatin and fatty liver    History   Social History  . Marital Status: Married    Spouse Name: N/A    Number of Children: N/A  . Years of Education: N/A   Occupational History  . Not on file.   Social History Main Topics  . Smoking status: Never Smoker   . Smokeless tobacco: Never Used  . Alcohol Use: No  . Drug Use: No  . Sexually Active: Not on file   Other Topics Concern  . Not on file   Social History Narrative   Married   Never Smoked   Alcohol use-no (occasional) 1x/month 1-2 beers at a time...trying to reduce   Drug use-no    Regular exercise-yes     Occupation:  Programmer          No past surgical history on file.  Family History  Problem Relation Age of Onset  . Diabetes Mother   . Hypertension Mother   . Stroke Maternal Grandfather   . Coronary artery disease Maternal Uncle   . Other Neg Hx     no FH of colon cancer    No Known Allergies  Current Outpatient Prescriptions on File Prior to Visit  Medication Sig Dispense Refill  . aspirin EC 81 MG tablet Take 81 mg by mouth daily.      . Multiple Vitamin (MULTIVITAMIN) tablet Take 1 tablet by mouth daily.        . Omega-3 Fatty Acids (FISH OIL) 1000 MG CAPS Take 2,000 mg by mouth daily.       . Pitavastatin Calcium 2 MG TABS Take 1/2 tablet [1 mg] by mouth daily at bedtime  45 tablet  1   No current facility-administered medications on file prior to visit.    BP 120/90  Pulse 73  Temp(Src) 97.7 F (36.5 C) (Oral)  Resp 16  Ht  5' 9.25" (1.759 m)  Wt 218 lb 1.3 oz (98.92 kg)  BMI 31.97 kg/m2  SpO2 98%    Objective:   Physical Exam  Constitutional: He appears well-developed and well-nourished. No distress.  Cardiovascular: Normal rate and regular rhythm.   No murmur heard. Pulmonary/Chest: Effort normal and breath sounds normal. No respiratory distress. He has no wheezes. He has no rales. He exhibits no tenderness.  Musculoskeletal: He exhibits no edema.  Psychiatric: He has a normal mood and affect. His behavior is normal. Judgment and thought content normal.          Assessment & Plan:   BP Readings from Last 3 Encounters:  03/18/13 120/90  02/08/13 150/92  07/10/12 149/94

## 2013-03-18 NOTE — Assessment & Plan Note (Signed)
Improved.  Obtain BMET.

## 2013-03-19 ENCOUNTER — Encounter: Payer: Self-pay | Admitting: Family

## 2013-06-22 ENCOUNTER — Other Ambulatory Visit: Payer: Self-pay | Admitting: Family

## 2013-06-22 ENCOUNTER — Encounter: Payer: Self-pay | Admitting: Family

## 2013-06-22 ENCOUNTER — Ambulatory Visit (INDEPENDENT_AMBULATORY_CARE_PROVIDER_SITE_OTHER): Payer: Managed Care, Other (non HMO) | Admitting: Family

## 2013-06-22 ENCOUNTER — Telehealth: Payer: Self-pay | Admitting: *Deleted

## 2013-06-22 VITALS — BP 122/86 | HR 78 | Temp 97.6°F | Resp 16 | Ht 69.25 in | Wt 214.1 lb

## 2013-06-22 DIAGNOSIS — I10 Essential (primary) hypertension: Secondary | ICD-10-CM

## 2013-06-22 DIAGNOSIS — E785 Hyperlipidemia, unspecified: Secondary | ICD-10-CM

## 2013-06-22 MED ORDER — PITAVASTATIN CALCIUM 2 MG PO TABS: ORAL_TABLET | ORAL | Status: DC

## 2013-06-22 MED ORDER — HYDROCHLOROTHIAZIDE 25 MG PO TABS: 25.0000 mg | ORAL_TABLET | Freq: Every day | ORAL | Status: DC

## 2013-06-22 NOTE — Progress Notes (Signed)
  Subjective:    Patient ID: [name] [name], male    DOB: [birth year], 37 y.o.   MRN: [id number]  HPI    Review of Systems     Objective:   Physical Exam        Assessment & Plan:  rx cancelled at [facility] for livalo at pt request.

## 2013-06-22 NOTE — Patient Instructions (Addendum)
Please follow up in 6 months, sooner if problems or concerns.

## 2013-06-22 NOTE — Assessment & Plan Note (Signed)
Tolerating livalo. Continue same, obtain FLP next visit.

## 2013-06-22 NOTE — Assessment & Plan Note (Signed)
BP Readings from Last 3 Encounters:  06/22/13 122/86  03/18/13 120/90  02/08/13 150/92   BP is stable on HCTZ, continue same.

## 2013-06-22 NOTE — Telephone Encounter (Signed)
Received rx transmission failure for:  hydrochlorothiazide (HYDRODIURIL) 25 MG tablet 90 tablet 1 06/22/2013 Sig - Route: Take 1 tablet (25 mg total) by mouth daily. - Oral E-Prescribing Status: Transmission to pharmacy failed (06/22/2013 3:37 PM EDT)   Rx called to [facility]

## 2013-06-22 NOTE — Progress Notes (Signed)
Subjective:    Patient ID: [name] [name], male    DOB: Nov 09, 1976, 37 y.o.   MRN: [id number]  HPI  [name] [name] is a 37 year old male who presents today to follow up for HTN HTN- States he is feeling well. Denies SOB, headaches, vison changes. Taking HCTZ, multivitamin, fish oil, and daily Asprin. Blood pressure has been good when he checked at home, doesn't recall values. Has increased his exercise.    Review of Systems  Constitutional: Negative for fever, chills, activity change, appetite change and fatigue.  HENT: Negative for ear pain, congestion, rhinorrhea, sneezing, postnasal drip, sinus pressure and ear discharge.   Eyes: Negative for pain and visual disturbance.  Respiratory: Negative.   Cardiovascular: Negative for chest pain, palpitations and leg swelling.  Gastrointestinal: Negative.  Negative for abdominal pain.  Neurological: Negative for dizziness, weakness, light-headedness and numbness.  Psychiatric/Behavioral: Negative.    Past Medical History  Diagnosis Date  . Hyperlipidemia   . Fatty liver     history of fatty liver  . Abnormal LFTs (liver function tests)     secondary to lovastatin and fatty liver    History   Social History  . Marital Status: Married    Spouse Name: N/A    Number of Children: N/A  . Years of Education: N/A   Occupational History  . Not on file.   Social History Main Topics  . Smoking status: Never Smoker   . Smokeless tobacco: Never Used  . Alcohol Use: No  . Drug Use: No  . Sexually Active: Not on file   Other Topics Concern  . Not on file   Social History Narrative   Married   Never Smoked   Alcohol use-no (occasional) 1x/month 1-2 beers at a time...trying to reduce   Drug use-no    Regular exercise-yes     Occupation:  Programmer          No past surgical history on file.  Family History  Problem Relation Age of Onset  . Diabetes Mother   . Hypertension Mother   . Stroke Maternal Grandfather   . Coronary  artery disease Maternal Uncle   . Other Neg Hx     no FH of colon cancer    No Known Allergies  Current Outpatient Prescriptions on File Prior to Visit  Medication Sig Dispense Refill  . aspirin EC 81 MG tablet Take 81 mg by mouth daily.      . Multiple Vitamin (MULTIVITAMIN) tablet Take 1 tablet by mouth daily.        . Omega-3 Fatty Acids (FISH OIL) 1000 MG CAPS Take 2,000 mg by mouth daily.        No current facility-administered medications on file prior to visit.    BP 122/86  Pulse 78  Temp(Src) 97.6 F (36.4 C) (Oral)  Resp 16  Ht 5' 9.25" (1.759 m)  Wt 214 lb 1.9 oz (97.124 kg)  BMI 31.39 kg/m2  SpO2 97%        Objective:   Physical Exam  Constitutional: He is oriented to person, place, and time. He appears well-developed and well-nourished. No distress.  HENT:  Head: Normocephalic and atraumatic.  Cardiovascular: Normal rate and regular rhythm.   No murmur heard. Pulmonary/Chest: Effort normal and breath sounds normal. No respiratory distress. He has no wheezes. He has no rales. He exhibits no tenderness.  Musculoskeletal: He exhibits no edema.  Neurological: He is alert and oriented to person, place, and time.  Psychiatric: He has a normal mood and affect. His behavior is normal. Judgment and thought content normal.          Assessment & Plan:

## 2013-11-04 ENCOUNTER — Other Ambulatory Visit: Payer: Self-pay

## 2013-12-22 ENCOUNTER — Ambulatory Visit: Payer: Managed Care, Other (non HMO) | Admitting: Family

## 2013-12-24 ENCOUNTER — Ambulatory Visit: Payer: Managed Care, Other (non HMO) | Admitting: Family

## 2014-10-14 ENCOUNTER — Other Ambulatory Visit: Payer: Self-pay

## 2014-12-19 ENCOUNTER — Encounter: Payer: Self-pay | Admitting: Medical

## 2014-12-19 ENCOUNTER — Ambulatory Visit (INDEPENDENT_AMBULATORY_CARE_PROVIDER_SITE_OTHER): Payer: Managed Care, Other (non HMO) | Admitting: Medical

## 2014-12-19 VITALS — BP 155/90 | HR 97 | Temp 98.4°F | Ht 69.0 in | Wt 199.8 lb

## 2014-12-19 DIAGNOSIS — H6502 Acute serous otitis media, left ear: Secondary | ICD-10-CM

## 2014-12-19 DIAGNOSIS — H6690 Otitis media, unspecified, unspecified ear: Secondary | ICD-10-CM | POA: Insufficient documentation

## 2014-12-19 DIAGNOSIS — B349 Viral infection, unspecified: Secondary | ICD-10-CM | POA: Insufficient documentation

## 2014-12-19 MED ORDER — BENZONATATE 100 MG PO CAPS: 100.0000 mg | ORAL_CAPSULE | Freq: Three times a day (TID) | ORAL | Status: DC | PRN

## 2014-12-19 MED ORDER — FLUTICASONE PROPIONATE 50 MCG/ACT NA SUSP: 2.0000 | Freq: Every day | NASAL | Status: DC

## 2014-12-19 MED ORDER — AZITHROMYCIN 250 MG PO TABS: ORAL_TABLET | ORAL | Status: DC

## 2014-12-19 NOTE — Patient Instructions (Addendum)
Your appear to have had viral syndrome recently with residual cough and nasal congestion. By exam you may have secondary lt ear infection/om.  I am prescribing benzonatate for the cough, and flonase for nasal congestion.   Follow up in 7 days or as needed.  Pt blood pressure here little high today. Recommended recheck in 2 wks. Also check at pharmacy. He is not on any decongestants. Pt wife is NP. I asked him to get her to check bp  at home.

## 2014-12-19 NOTE — Progress Notes (Signed)
Pre visit review using our clinic review tool, if applicable. No additional management support is needed unless otherwise documented below in the visit note. 

## 2014-12-19 NOTE — Assessment & Plan Note (Signed)
By exam you may have secondary lt ear infection/om.  If you have any ear pain start azithromycin antibiotic.

## 2014-12-19 NOTE — Progress Notes (Signed)
Subjective:    Patient ID: [name] [name], male    DOB: [birth year], 38 y.o.   MRN: [id number]  HPI   Pt in today reporting  Cough dry cough, nasal congestion and runny nose for  5 Days. At beginning he had some body aches(head to toe type). Body aches are improving but some faint residual. Pt states early on he felt fever. Pt missed 3 days of work since last wed.   Associated symptoms( below yes or no)  Fever-yes, that is resolved. Chills-yes. Today faint mild chill today. Chest congestion-no Sneezing- occasional sneezing. Itching eyes-no Sore throat- mild  Post-nasal drainage-yes Wheezing-no Purulent nasal drainage-no Fatigue-mild. Today better than he was.  Past Medical History  Diagnosis Date  . Hyperlipidemia   . Fatty liver     history of fatty liver  . Abnormal LFTs (liver function tests)     secondary to lovastatin and fatty liver    History   Social History  . Marital Status: Married    Spouse Name: N/A    Number of Children: N/A  . Years of Education: N/A   Occupational History  . Not on file.   Social History Main Topics  . Smoking status: Never Smoker   . Smokeless tobacco: Never Used  . Alcohol Use: No  . Drug Use: No  . Sexual Activity: Not on file   Other Topics Concern  . Not on file   Social History Narrative   Married   Never Smoked   Alcohol use-no (occasional) 1x/month 1-2 beers at a time...trying to reduce   Drug use-no    Regular exercise-yes     Occupation:  Programmer          No past surgical history on file.  Family History  Problem Relation Age of Onset  . Diabetes Mother   . Hypertension Mother   . Stroke Maternal Grandfather   . Coronary artery disease Maternal Uncle   . Other Neg Hx     no FH of colon cancer    No Known Allergies  Current Outpatient Prescriptions on File Prior to Visit  Medication Sig Dispense Refill  . aspirin EC 81 MG tablet Take 81 mg by mouth daily.    . hydrochlorothiazide  (HYDRODIURIL) 25 MG tablet TAKE ONE TABLET BY MOUTH ONCE DAILY (Patient not taking: Reported on 12/19/2014) 90 tablet 0  . Multiple Vitamin (MULTIVITAMIN) tablet Take 1 tablet by mouth daily.      . Omega-3 Fatty Acids (FISH OIL) 1000 MG CAPS Take 2,000 mg by mouth daily.     . Pitavastatin Calcium 2 MG TABS Take 1/2 tablet [1 mg] by mouth daily at bedtime (Patient not taking: Reported on 12/19/2014) 45 tablet 1   No current facility-administered medications on file prior to visit.    BP 158/100 mmHg  Pulse 97  Temp(Src) 98.4 F (36.9 C) (Oral)  Ht 5\' 9"  (1.753 m)  Wt 199 lb 12.8 oz (90.629 kg)  BMI 29.49 kg/m2  SpO2 94%        Review of Systems  Constitutional: Positive for chills and fatigue. Negative for fever.       Fiant mild fatigue now.   HENT: Positive for congestion, postnasal drip and sore throat. Negative for sinus pressure.        Mild.  Respiratory: Positive for cough. Negative for wheezing.   Cardiovascular: Negative for chest pain and palpitations.  Musculoskeletal: Negative for back pain and neck pain.  Neurological: Negative for  dizziness and headaches.  Hematological: Negative for adenopathy. Does not bruise/bleed easily.       Objective:   Physical Exam   General  Mental Status - Alert. General Appearance - Well groomed. Not in acute distress.  Skin Rashes- No Rashes.  HEENT Head- Normal. Ear Auditory Canal - Left- Normal. Right - Normal.Tympanic Membrane- Left- moderate red tm. Right- Normal. Eye Sclera/Conjunctiva- Left- Normal. Right- Normal. Nose & Sinuses Nasal Mucosa- Left-  Mild  boggy + Congested. Right-  Mild  Boggy + Congested. Mouth & Throat Lips: Upper Lip- Normal: no dryness, cracking, pallor, cyanosis, or vesicular eruption. Lower Lip-Normal: no dryness, cracking, pallor, cyanosis or vesicular eruption. Buccal Mucosa- Bilateral- No Aphthous ulcers. Oropharynx- No Discharge or Erythema. Tonsils: Characteristics- Bilateral- No  Erythema or Congestion. Size/Enlargement- Bilateral- No enlargement. Discharge- bilateral-None.  Neck Neck- Supple. No Masses.   Chest and Lung Exam Auscultation: Breath Sounds:- even and unlabored.  Cardiovascular Auscultation:Rythm- Regular, rate and rhythm. Murmurs & Other Heart Sounds:Ausculatation of the heart reveal- No Murmurs.  Lymphatic Head & Neck General Head & Neck Lymphatics: Bilateral: Description- No Localized lymphadenopathy.        Assessment & Plan:

## 2014-12-19 NOTE — Assessment & Plan Note (Signed)
Your appear to have had viral syndrome recently with residual cough and nasal congestion. By exam you may have secondary lt ear infection/om.  I am prescribing benzonatate for the cough, and flonase for nasal congestion.  If you have any ear pain start azithromycin antibiotic.

## 2016-05-16 ENCOUNTER — Ambulatory Visit (INDEPENDENT_AMBULATORY_CARE_PROVIDER_SITE_OTHER): Payer: Managed Care, Other (non HMO) | Admitting: Internal Medicine

## 2016-05-16 ENCOUNTER — Encounter: Payer: Self-pay | Admitting: Internal Medicine

## 2016-05-16 VITALS — BP 130/80 | HR 75 | Ht 70.0 in | Wt 218.0 lb

## 2016-05-16 DIAGNOSIS — R740 Nonspecific elevation of levels of transaminase and lactic acid dehydrogenase [LDH]: Secondary | ICD-10-CM

## 2016-05-16 DIAGNOSIS — R748 Abnormal levels of other serum enzymes: Secondary | ICD-10-CM

## 2016-05-16 DIAGNOSIS — K760 Fatty (change of) liver, not elsewhere classified: Secondary | ICD-10-CM

## 2016-05-16 NOTE — Progress Notes (Addendum)
Referred by: Debbrah Alar, NP  Subjective:    Patient ID: [name] [name], male    DOB: [birth year], 40 y.o.   MRN: [id number] Chief complaint: Fatty liver follow-up HPI The patient is a pleasant [place] man with a history of suspected fatty liver. He presents with his wife who is a Designer, jewellery, and wanted to know if there was anything new for treatment or follow-up etc. He has no new health problems I'm aware of. Apparently he still has mild abnormalities of his transaminases detected at work physical. Lipids were done as well when. He does not drink alcohol. Wt Readings from Last 3 Encounters:  05/16/16 218 lb (98.884 kg)  12/19/14 199 lb 12.8 oz (90.629 kg)  06/22/13 214 lb 1.9 oz (97.124 kg)    Medications, allergies, past medical history, past surgical history, family history and social history are reviewed and updated in the EMR.   Review of Systems As per history of present illness. Some seasonal allergies with cough and rhinitis symptoms. All other review of systems are negative.    Objective:   Physical Exam '@BP'  130/80 mmHg  Pulse 75  Ht '5\' 10"'  (1.778 m)  Wt 218 lb (98.884 kg)  BMI 31.28 kg/m2@  General:  Well-developed, well-nourished and in no acute distress Eyes:  anicteric. Lungs: Clear to auscultation bilaterally. Heart:  S1S2, no rubs, murmurs, gallops. Abdomen:  soft, non-tender, no hepatosplenomegaly, hernia, or mass and BS+.  extremities:   no edema, cyanosis or clubbing Skin   no rash. No signs of chronic liver disease Neuro:  A&O x 3.  Psych:  appropriate mood and  Affect.   Data Reviewed:  I requested copies of his January labs done at his workplace, he works at [employer] He will sent those to me. They have an home. Last LFTs we have systems show ALT 74 AST normal. Bilirubin and alkaline phosphatase normal as well. 2014. Platelets have been normal. He has been screened for celiac with tissue transglutaminase antibodies and IgA level both  of which were negative or normal.    2011 S [place] plan from my note Long-standing problem going back to 2006 at least. He has had persistent abnormal transaminases 1.5-3 times abnormal. A CT scan in 2006 demonstrated low attenuation throughout the liver and in the context of his hyperlipidemia and other test results and has been thought that he has fatty liver disease. It still seems likely though we have not proven that with a biopsy. His lab work up has shown a ferritin of 337 in 2007, iron saturation 28%. Ceruloplasmin low at 17.3 and than normal. Hepatitis C antibody, the surface antigen, be cord antibody and were normal or negative in July 2006. ANA -2006. AST 70, ALT 112 in November 2010. Alkaline phosphatase and bilirubin have always been normal.  Fatty liver still seems like the most likely diagnosis, do not know if BX we has hepatitis. I have explained the liver biopsy would be the most accurate way to examine this. Reactions to statin therapy is also in the differential. We will perform additional laboratory testing as outlined below and then discuss further. I have also discussed the possibility of a consult at the [place] [facility] where they have expertise in fatty liver disease. He could lose some weight though his BMI is 28 he is certainly not significantly obese i.e. morbidly so. His overall situation is a little unusual in my opinion given his ethnicity and his BMI.  He has been vaccinated for hepatitis  B. Negative hepatitis C. He does have a history of hypertriglyceridemia and hyperlipidemia. Smooth muscle antibodies been negative as well.   Assessment & Plan:   1. Fatty liver disease, nonalcoholic   2. Abnormal transaminases    His wife asked about vitamin E treatment, asked about Actos, and any other treatments it might available. I explained that right now there is no proven treatment other than weight loss. There are increased cardiovascular risks with vitamin E supplementation  so in general I have stated away from that. Actos is not a good option either due to potential side effects etc. I discussed the possibility of going to [facility] were they have a center that studies fatty liver. I also discussed the possibility of biopsied to be sure that's what we are dealing with.   Have decided to review previous labs from January. We'll go with a hepatic ultrasound with elastograophy. Other plans pending that.    I appreciate the opportunity to care for this patient. CC: O'SULLIVAN,MELISSA S., NP   ALT 61 AST 27 NL bili and alk phos 11/2015 PLT 229 A1C 5.6%

## 2016-05-16 NOTE — Patient Instructions (Signed)
    You have been scheduled for an abdominal ultrasound at [facility]1st floor of hospital) on 06/12/16 at 6:45AM. Please arrive 15 minutes prior to your appointment for registration. Make certain not to have anything to eat or drink after midnight prior to your appointment. Should you need to reschedule your appointment, please contact radiology at [phone]. This test typically takes about 30 minutes to perform.    Please e-mail your lab results to Dr Leone PayorGessner.    I appreciate the opportunity to care for you. Stan Headarl Gessner, MD, [facility]

## 2016-05-17 ENCOUNTER — Encounter: Payer: Self-pay | Admitting: Internal Medicine

## 2016-05-31 ENCOUNTER — Encounter: Payer: Self-pay | Admitting: Internal Medicine

## 2016-06-12 ENCOUNTER — Ambulatory Visit (HOSPITAL_COMMUNITY)
Admission: RE | Admit: 2016-06-12 | Discharge: 2016-06-12 | Disposition: A | Discharge status: Home / Self Care | Payer: Managed Care, Other (non HMO) | Source: Ambulatory Visit | Attending: Internal Medicine | Admitting: Internal Medicine

## 2016-06-12 DIAGNOSIS — R740 Nonspecific elevation of levels of transaminase and lactic acid dehydrogenase [LDH]: Secondary | ICD-10-CM | POA: Diagnosis not present

## 2016-06-12 DIAGNOSIS — R748 Abnormal levels of other serum enzymes: Secondary | ICD-10-CM

## 2016-06-12 DIAGNOSIS — R932 Abnormal findings on diagnostic imaging of liver and biliary tract: Secondary | ICD-10-CM | POA: Insufficient documentation

## 2016-06-12 DIAGNOSIS — K760 Fatty (change of) liver, not elsewhere classified: Secondary | ICD-10-CM | POA: Diagnosis not present

## 2017-01-08 ENCOUNTER — Ambulatory Visit (HOSPITAL_BASED_OUTPATIENT_CLINIC_OR_DEPARTMENT_OTHER)
Admission: RE | Admit: 2017-01-08 | Discharge: 2017-01-08 | Disposition: A | Discharge status: Home / Self Care | Payer: BC Managed Care – PPO | Source: Ambulatory Visit | Attending: Medical | Admitting: Medical

## 2017-01-08 ENCOUNTER — Encounter: Payer: Self-pay | Admitting: Medical

## 2017-01-08 ENCOUNTER — Ambulatory Visit (INDEPENDENT_AMBULATORY_CARE_PROVIDER_SITE_OTHER): Payer: BC Managed Care – PPO | Admitting: Medical

## 2017-01-08 VITALS — BP 136/94 | HR 83 | Temp 98.1°F | Resp 16 | Ht 70.0 in | Wt 218.1 lb

## 2017-01-08 DIAGNOSIS — M25531 Pain in right wrist: Secondary | ICD-10-CM

## 2017-01-08 DIAGNOSIS — M79641 Pain in right hand: Secondary | ICD-10-CM | POA: Diagnosis not present

## 2017-01-08 LAB — URIC ACID: URIC ACID, SERUM: 8 mg/dL — AB (ref 4.0–7.8)

## 2017-01-08 LAB — CBC WITH DIFFERENTIAL/PLATELET
Basophils Absolute: 0 10*3/uL (ref 0.0–0.1)
Basophils Relative: 0.3 % (ref 0.0–3.0)
Eosinophils Absolute: 0.1 10*3/uL (ref 0.0–0.7)
Eosinophils Relative: 1.6 % (ref 0.0–5.0)
HCT: 46.5 % (ref 39.0–52.0)
Hemoglobin: 16.1 g/dL (ref 13.0–17.0)
Lymphocytes Relative: 26.7 % (ref 12.0–46.0)
Lymphs Abs: 2.3 10*3/uL (ref 0.7–4.0)
MCHC: 34.7 g/dL (ref 30.0–36.0)
MCV: 86.8 fl (ref 78.0–100.0)
Monocytes Absolute: 0.6 10*3/uL (ref 0.1–1.0)
Monocytes Relative: 6.8 % (ref 3.0–12.0)
Neutro Abs: 5.5 10*3/uL (ref 1.4–7.7)
Neutrophils Relative %: 64.6 % (ref 43.0–77.0)
Platelets: 273 10*3/uL (ref 150.0–400.0)
RBC: 5.35 Mil/uL (ref 4.22–5.81)
RDW: 12.9 % (ref 11.5–15.5)
WBC: 8.5 10*3/uL (ref 4.0–10.5)

## 2017-01-08 MED ORDER — DICLOFENAC SODIUM 75 MG PO TBEC: 75.0000 mg | DELAYED_RELEASE_TABLET | Freq: Two times a day (BID) | ORAL | 0 refills | Status: DC

## 2017-01-08 MED ORDER — KETOROLAC TROMETHAMINE 60 MG/2ML IM SOLN
60.0000 mg | Freq: Once | INTRAMUSCULAR | Status: AC
  Administered 2017-01-08: 60 mg via INTRAMUSCULAR

## 2017-01-08 NOTE — Progress Notes (Signed)
Pre visit review using our clinic review tool, if applicable. No additional management support is needed unless otherwise documented below in the visit note/SLS  

## 2017-01-08 NOTE — Progress Notes (Signed)
   Subjective:    Patient ID: [name] [name], male    DOB: [birth year], 41 y.o.   MRN: [id number]  HPI  Pt had some recent rt hand proximal region  Pain and wrist pain.Marland Kitchen. He has some slight hand pain and swelling before christmas. But this Sunday he started to get more pain and swelling. No recent trauma or fall. No injury. No insect bite.   No fever, no chills or sweats.  No hx of gout.  Pt has been taking advil.  Pt notes typing seemed to make pain worse.    Review of Systems  Constitutional: Negative for chills, fatigue and fever.  Respiratory: Negative for cough and chest tightness.   Cardiovascular: Negative for chest pain and palpitations.  Gastrointestinal: Negative for abdominal pain.  Musculoskeletal:       Rt wrist pain and hand pain  Skin:       Some rfaint redness and warmth.   Hematological: Negative for adenopathy. Does not bruise/bleed easily.       Objective:   Physical Exam  General- No acute distress. Pleasant patient. Neck- Full range of motion, no jvd Lungs- Clear, even and unlabored. Heart- regular rate and rhythm. Neurologic- CNII- XII grossly intact.  Rt hand and wrist- moderate swollen and warm to touch. Mild tender(is swollen compared to lt side). Faint redness.       Assessment & Plan:  For your wrist/hand pain we gave toradol 60 mg im. Stop advil otc. Rx diclofenac given. Can start this tomorrow around 1 pm.   Will follow your cbc, uric acid and xray of wrist.  Your diagnosis may be gout, severe wrist tendinitis, skin infection or carpel tunnel(but doubt cts presently). Other potential dx but I think these are at top of list.  Follow up 7 days or as needed.   May need to give antibiotic but will follow your response to above and review labs first.

## 2017-01-08 NOTE — Patient Instructions (Addendum)
For your wrist/hand pain we gave toradol 60 mg im. Stop advil otc. Rx diclofenac given. Can start this tomorrow around 1 pm.   Will follow your cbc, uric acid and xray of wrist.  Your diagnosis may be gout, severe wrist tendinitis, skin infection or carpel tunnel(but doubt cts presently). Other potential dx but I think these are at top of list.  Follow up 7 days or as needed.   May need to give antibiotic but will follow your response to above and review labs first.

## 2017-01-09 ENCOUNTER — Telehealth: Payer: Self-pay | Admitting: Family

## 2017-01-09 NOTE — Telephone Encounter (Signed)
Relation to pt:  Glodowski,Thu Call back number:[phone]   Reason for call:  Returning call regarding lab results

## 2017-01-10 NOTE — Telephone Encounter (Signed)
Copy & Paste into Result note. 

## 2017-01-27 ENCOUNTER — Ambulatory Visit: Payer: BC Managed Care – PPO | Admitting: Family

## 2017-02-10 ENCOUNTER — Ambulatory Visit: Payer: Self-pay | Admitting: Family

## 2017-07-31 ENCOUNTER — Emergency Department (HOSPITAL_COMMUNITY): Payer: BC Managed Care – PPO

## 2017-07-31 ENCOUNTER — Emergency Department (HOSPITAL_COMMUNITY)
Admission: EM | Admit: 2017-07-31 | Discharge: 2017-07-31 | Disposition: A | Discharge status: Home / Self Care | Payer: BC Managed Care – PPO | Attending: Physician Assistant | Admitting: Physician Assistant

## 2017-07-31 ENCOUNTER — Encounter (HOSPITAL_COMMUNITY): Payer: Self-pay | Admitting: *Deleted

## 2017-07-31 DIAGNOSIS — I10 Essential (primary) hypertension: Secondary | ICD-10-CM | POA: Diagnosis not present

## 2017-07-31 DIAGNOSIS — Z79899 Other long term (current) drug therapy: Secondary | ICD-10-CM | POA: Insufficient documentation

## 2017-07-31 DIAGNOSIS — Y92008 Other place in unspecified non-institutional (private) residence as the place of occurrence of the external cause: Secondary | ICD-10-CM | POA: Diagnosis not present

## 2017-07-31 DIAGNOSIS — Z7982 Long term (current) use of aspirin: Secondary | ICD-10-CM | POA: Insufficient documentation

## 2017-07-31 DIAGNOSIS — W19XXXA Unspecified fall, initial encounter: Secondary | ICD-10-CM

## 2017-07-31 DIAGNOSIS — Y999 Unspecified external cause status: Secondary | ICD-10-CM | POA: Insufficient documentation

## 2017-07-31 DIAGNOSIS — S92009A Unspecified fracture of unspecified calcaneus, initial encounter for closed fracture: Secondary | ICD-10-CM

## 2017-07-31 DIAGNOSIS — W139XXA Fall from, out of or through building, not otherwise specified, initial encounter: Secondary | ICD-10-CM | POA: Insufficient documentation

## 2017-07-31 DIAGNOSIS — S92015A Nondisplaced fracture of body of left calcaneus, initial encounter for closed fracture: Secondary | ICD-10-CM | POA: Insufficient documentation

## 2017-07-31 DIAGNOSIS — Y939 Activity, unspecified: Secondary | ICD-10-CM | POA: Insufficient documentation

## 2017-07-31 DIAGNOSIS — S99922A Unspecified injury of left foot, initial encounter: Secondary | ICD-10-CM | POA: Diagnosis present

## 2017-07-31 HISTORY — DX: Unspecified fracture of unspecified calcaneus, initial encounter for closed fracture: S92.009A

## 2017-07-31 MED ORDER — NAPROXEN 500 MG PO TABS: 500.0000 mg | ORAL_TABLET | Freq: Two times a day (BID) | ORAL | 0 refills | Status: DC

## 2017-07-31 MED ORDER — IBUPROFEN 200 MG PO TABS
600.0000 mg | ORAL_TABLET | Freq: Once | ORAL | Status: AC
  Administered 2017-07-31: 600 mg via ORAL
  Filled 2017-07-31: qty 3

## 2017-07-31 NOTE — ED Provider Notes (Signed)
[facility]-EMERGENCY DEPT Provider Note   CSN: [id number] Arrival date & time: 07/31/17  1925   History   Chief Complaint Chief Complaint  Patient presents with  . Foot Injury   The history is provided by the patient. No language interpreter was used.   HPI Comments: [name] [name] is a 41 y.o. male who presents to the Emergency Department complaining of  heel pain. He reports he fell from his attic approximately an hour ago and landed on his left heel. Wife in room with patient and reports she started noticing swelling of his left ankle and foot right away. Pt has been unable to bear weight on his left ankle since the incident and has had to hop from the car to the ED. He reports a pain that's 7/10 and has not taken any meds. Denies head injury, weakness, Fevers, back pain, loss of bowel control, loss of bladder control, SOB, CP, palpitations, numbness, tingling, LOC, or pain in other areas.   Past Medical History:  Diagnosis Date  . Abnormal LFTs (liver function tests)    secondary to lovastatin and fatty liver  . Fatty liver    history of fatty liver  . Hyperlipidemia     Patient Active Problem List   Diagnosis Date Noted  . Viral syndrome 12/19/2014  . Otitis media 12/19/2014  . Testicular pain, right 07/12/2012  . Hyperglycemia 03/13/2012  . HTN (hypertension) 03/13/2012  . HYPERLIPIDEMIA 06/26/2009  . FATTY LIVER DISEASE 06/26/2009  . TRANSAMINASES, SERUM, ELEVATED 06/26/2009    History reviewed. No pertinent surgical history.     Home Medications    Prior to Admission medications   Medication Sig Start Date End Date Taking? Authorizing Provider  aspirin EC 81 MG tablet Take 81 mg by mouth daily.    [provider]  hydrochlorothiazide (HYDRODIURIL) 25 MG tablet TAKE ONE TABLET BY MOUTH ONCE DAILY 06/22/13   Sandford Craze'Sullivan, Melissa, NP  naproxen (NAPROSYN) 500 MG tablet Take 1 tablet (500 mg total) by mouth 2 (two) times daily with a meal. 07/31/17   Everlene Farrieransie, Terrill Wauters,  PA-C  Omega-3 Fatty Acids (FISH OIL) 1000 MG CAPS Take 2,000 mg by mouth daily.     [provider]  Pitavastatin Calcium 2 MG TABS Take 1/2 tablet [1 mg] by mouth daily at bedtime 06/22/13   Sandford Craze'Sullivan, Melissa, NP    Family History Family History  Problem Relation Age of Onset  . Diabetes Mother   . Hypertension Mother   . Stroke Maternal Grandfather   . Coronary artery disease Maternal Uncle   . Other Neg Hx        no FH of colon cancer    Social History Social History  Substance Use Topics  . Smoking status: Never Smoker  . Smokeless tobacco: Never Used  . Alcohol use No     Allergies   Patient has no known allergies.   Review of Systems Review of Systems  Constitutional: Negative for fever.  Respiratory: Negative for shortness of breath.   Cardiovascular: Negative for chest pain.  Gastrointestinal: Negative for abdominal pain, nausea and vomiting.  Musculoskeletal: Positive for arthralgias. Negative for back pain and neck pain.  Skin: Negative for rash and wound.  Neurological: Negative for dizziness, syncope, weakness, light-headedness, numbness and headaches.     Physical Exam Updated Vital Signs BP (!) 159/87 (BP Location: Right Arm)   Pulse 86   Temp 98.4 F (36.9 C) (Oral)   Resp 18   SpO2 100%  Physical Exam  Constitutional: He appears well-developed and well-nourished. No distress.  Nontoxic appearing.  HENT:  Head: Normocephalic and atraumatic.  Eyes: Right eye exhibits no discharge. Left eye exhibits no discharge.  Neck: Neck supple.  Cardiovascular: Normal rate, regular rhythm, normal heart sounds and intact distal pulses.   Bilateral dorsalis pedis and posterior tibialis pulses are intact. Good capillary refill to his bilateral toes.  Pulmonary/Chest: Effort normal. No respiratory distress.  Abdominal: Soft. There is no tenderness.  Musculoskeletal: Normal range of motion. He exhibits tenderness. He exhibits no deformity.  Patient  has tenderness to his left heel. No deformity noted. No ankle instability noted. No tenderness noted to his left shin, knee, thigh, or hip. No midline neck or back TTP.   Lymphadenopathy:    He has no cervical adenopathy.  Neurological: He is alert. No sensory deficit. He exhibits normal muscle tone. Coordination normal.  Sensation and strength is intact his bilateral lower extremities.  Skin: Skin is warm and dry. Capillary refill takes less than 2 seconds. No rash noted. He is not diaphoretic. No erythema. No pallor.  Psychiatric: He has a normal mood and affect. His behavior is normal.  Nursing note and vitals reviewed.    ED Treatments / Results   DIAGNOSTIC STUDIES: Oxygen Saturation is 97% on RA, normal by my interpretation.   COORDINATION OF CARE: 9:40 PM-Discussed next steps with pt. Pt verbalized understanding and is agreeable with the plan.   Labs (all labs ordered are listed, but only abnormal results are displayed) Labs Reviewed - No data to display  EKG  EKG Interpretation None       Radiology Dg Lumbar Spine Complete  Result Date: 07/31/2017 CLINICAL DATA:  41 year old male with lumbar spine pain. He fell at of the attic earlier today and sustained a calcaneal fracture. EXAM: LUMBAR SPINE - COMPLETE 4+ VIEW COMPARISON:  None. FINDINGS: There is no evidence of lumbar spine fracture. Alignment is normal. Intervertebral disc spaces are maintained. IMPRESSION: Negative. Electronically Signed   By: Malachy Moan M.D.   On: 07/31/2017 21:12   Dg Ankle Complete Left  Result Date: 07/31/2017 CLINICAL DATA:  41 year old male with fall and left foot pain. EXAM: LEFT ANKLE COMPLETE - 3+ VIEW; LEFT FOOT - COMPLETE 3+ VIEW COMPARISON:  None. FINDINGS: There are multiple fractures of the calcaneal bone. There is a minimal displacement of a fracture fragment of dorsal calcaneus. The remainder of the visualized bones are intact. There is no dislocation. The ankle mortise is  intact. There is soft tissue swelling of the ankle and hindfoot. There is edema in the Kager's fat pad. IMPRESSION: Comminuted fractures of the calcaneus.  No dislocation. Electronically Signed   By: Elgie Collard M.D.   On: 07/31/2017 20:17   Dg Foot Complete Left  Result Date: 07/31/2017 CLINICAL DATA:  41 year old male with fall and left foot pain. EXAM: LEFT ANKLE COMPLETE - 3+ VIEW; LEFT FOOT - COMPLETE 3+ VIEW COMPARISON:  None. FINDINGS: There are multiple fractures of the calcaneal bone. There is a minimal displacement of a fracture fragment of dorsal calcaneus. The remainder of the visualized bones are intact. There is no dislocation. The ankle mortise is intact. There is soft tissue swelling of the ankle and hindfoot. There is edema in the Kager's fat pad. IMPRESSION: Comminuted fractures of the calcaneus.  No dislocation. Electronically Signed   By: Elgie Collard M.D.   On: 07/31/2017 20:17    Procedures Procedures (including critical care time) SPLINT APPLICATION  Date/Time: 9:32 PM Authorized by: Lawana Chambersansie,Arbell Wycoff Duncan Consent: Verbal consent obtained. Risks and benefits: risks, benefits and alternatives were discussed Consent given by: patient Splint applied by: orthopedic technician Location details: Left leg Splint type: posterior splint Post-procedure: The splinted body part was neurovascularly unchanged following the procedure. Patient tolerance: Patient tolerated the procedure well with no immediate complications.     Medications Ordered in ED Medications  ibuprofen (ADVIL,MOTRIN) tablet 600 mg (600 mg Oral Given 07/31/17 2123)     Initial Impression / Assessment and Plan / ED Course  I have reviewed the triage vital signs and the nursing notes.  Pertinent labs & imaging results that were available during my care of the patient were reviewed by me and considered in my medical decision making (see chart for details).    This is a 41 y.o. male who presents to the  Emergency Department complaining of  heel pain. He reports he fell from his attic approximately an hour ago and landed on his left heel. Wife in room with patient and reports she started noticing swelling of his left ankle and foot right away. Pt has been unable to bear weight on his left ankle since the incident and has had to hop from the car to the ED. He denies back pain, numbness, tingling or weakness.   On exam the patient is afebrile nontoxic appearing. He has tenderness to his left calcaneus. No deformity. He is neurovascularly intact. No other tenderness noted. No back tenderness to palpation. X-ray of his foot reveals a comminuted calcaneal fracture. X-ray of his lumbar spine was also obtained due to high risks of lumbar spine injury with calcaneal fracture. Lumbar spine film was negative. I attended to consult with orthopedic surgeon, but did not receive a call back in more than 30 mins. Will still plan to discharge with ortho follow up.  Patient was placed in a posterior splint and provided with crutches. He was advised to remain nonweightbearing. Splint precautions were provided. I advised him to elevate his leg as much as possible to reduce swelling. Naproxen for pain control. He declines prescription for a narcotic pain medicine. Return precautions were discussed. He was advised to follow-up with orthopedic surgeon Dr. August Saucerean. I advised the patient to follow-up with their primary care provider this week. I advised the patient to return to the emergency department with new or worsening symptoms or new concerns. The patient verbalized understanding and agreement with plan.    This patient was discussed with Dr. Corlis LeakMacKuen who agrees with assessment and plan.    Final Clinical Impressions(s) / ED Diagnoses   Final diagnoses:  Closed nondisplaced fracture of body of left calcaneus, initial encounter  Fall, initial encounter    New Prescriptions New Prescriptions   NAPROXEN (NAPROSYN) 500 MG  TABLET    Take 1 tablet (500 mg total) by mouth 2 (two) times daily with a meal.   I personally performed the services described in this documentation, which was scribed in my presence. The recorded information has been reviewed and is accurate.       Everlene FarrierDansie, Maryagnes Carrasco, PA-C 07/31/17 2144    Abelino DerrickMackuen, Courteney Lyn, MD 08/01/17 0000

## 2017-07-31 NOTE — ED Triage Notes (Signed)
Pt complains of left heel pain since falling from the attic and landing on his heel. Pt is unable to bend ankle

## 2017-08-07 ENCOUNTER — Ambulatory Visit
Admission: RE | Admit: 2017-08-07 | Discharge: 2017-08-07 | Disposition: A | Discharge status: Home / Self Care | Payer: BC Managed Care – PPO | Source: Ambulatory Visit | Attending: Orthopaedic Surgery | Admitting: Orthopaedic Surgery

## 2017-08-07 ENCOUNTER — Encounter (INDEPENDENT_AMBULATORY_CARE_PROVIDER_SITE_OTHER): Payer: Self-pay | Admitting: Orthopaedic Surgery

## 2017-08-07 ENCOUNTER — Ambulatory Visit (INDEPENDENT_AMBULATORY_CARE_PROVIDER_SITE_OTHER): Payer: BC Managed Care – PPO | Admitting: Orthopaedic Surgery

## 2017-08-07 DIAGNOSIS — S92062A Displaced intraarticular fracture of left calcaneus, initial encounter for closed fracture: Secondary | ICD-10-CM | POA: Diagnosis not present

## 2017-08-07 DIAGNOSIS — S92902A Unspecified fracture of left foot, initial encounter for closed fracture: Secondary | ICD-10-CM

## 2017-08-07 NOTE — Progress Notes (Signed)
Office Visit Note   Patient: [name] [name]           Date of Birth: Feb 26, 1976           MRN: [id number] Visit Date: 08/07/2017              Requested by: Sandford Craze'Sullivan, Melissa, NP [address] [place], [place] [id number] PCP: Sandford Craze'Sullivan, Melissa, NP   Assessment & Plan: Visit Diagnoses:  1. Displaced intraarticular fracture of left calcaneus, initial encounter for closed fracture     Plan: Urgent CT scan of the left foot to evaluate and fully characterize the calcaneus fracture for need for surgery or not. Questions encouraged and answered. Cam Walker. Nonweightbearing. Elevate and ice. We will be in touch with the patient regarding the wrist CT scan results. Patient needs to work from home for the next 6 weeks.  Follow-Up Instructions: Return if symptoms worsen or fail to improve.   Orders:  No orders of the defined types were placed in this encounter.  No orders of the defined types were placed in this encounter.     Procedures: No procedures performed   Clinical Data: No additional findings.   Subjective: Chief Complaint  Patient presents with  . Left Ankle - Pain    Patient is a 41 year old gentleman who sustained a left calcaneus fracture about a week ago from falling out of the attic. He was evaluated in the ER and placed in a splint and given follow-up today. He denies any numbness and tingling. Overall he is doing okay. He does have swelling. He is nonweightbearing with crutches.    Review of Systems  Constitutional: Negative.   All other systems reviewed and are negative.    Objective: Vital Signs: There were no vitals taken for this visit.  Physical Exam  Constitutional: He is oriented to person, place, and time. He appears well-developed and well-nourished.  HENT:  Head: Normocephalic and atraumatic.  Eyes: Pupils are equal, round, and reactive to light.  Neck: Neck supple.  Pulmonary/Chest: Effort normal.  Abdominal: Soft.    Musculoskeletal: Normal range of motion.  Neurological: He is alert and oriented to person, place, and time.  Skin: Skin is warm.  Psychiatric: He has a normal mood and affect. His behavior is normal. Judgment and thought content normal.  Nursing note and vitals reviewed.   Ortho Exam Left foot and ankle exam shows severe swelling with bruising. No fracture blisters. Neurovascular intact. Bounding pulses. Specialty Comments:  No specialty comments available.  Imaging: No results found.   PMFS History: Patient Active Problem List   Diagnosis Date Noted  . Displaced intraarticular fracture of left calcaneus, initial encounter for closed fracture 08/07/2017  . Viral syndrome 12/19/2014  . Otitis media 12/19/2014  . Testicular pain, right 07/12/2012  . Hyperglycemia 03/13/2012  . HTN (hypertension) 03/13/2012  . HYPERLIPIDEMIA 06/26/2009  . FATTY LIVER DISEASE 06/26/2009  . TRANSAMINASES, SERUM, ELEVATED 06/26/2009   Past Medical History:  Diagnosis Date  . Abnormal LFTs (liver function tests)    secondary to lovastatin and fatty liver  . Fatty liver    history of fatty liver  . Hyperlipidemia     Family History  Problem Relation Age of Onset  . Diabetes Mother   . Hypertension Mother   . Stroke Maternal Grandfather   . Coronary artery disease Maternal Uncle   . Other Neg Hx        no FH of colon cancer  No past surgical history on file. Social History   Occupational History  . Not on file.   Social History Main Topics  . Smoking status: Never Smoker  . Smokeless tobacco: Never Used  . Alcohol use No  . Drug use: No  . Sexual activity: Not on file

## 2017-08-21 ENCOUNTER — Encounter (INDEPENDENT_AMBULATORY_CARE_PROVIDER_SITE_OTHER): Payer: Self-pay | Admitting: Orthopaedic Surgery

## 2017-08-21 ENCOUNTER — Ambulatory Visit (INDEPENDENT_AMBULATORY_CARE_PROVIDER_SITE_OTHER): Payer: BC Managed Care – PPO | Admitting: Orthopaedic Surgery

## 2017-08-21 DIAGNOSIS — S92062A Displaced intraarticular fracture of left calcaneus, initial encounter for closed fracture: Secondary | ICD-10-CM

## 2017-08-21 NOTE — Progress Notes (Signed)
   Office Visit Note   Patient: Blair Hailey           Date of Birth: 1976-08-31           MRN: [id number] Visit Date: 08/21/2017              Requested by: Sandford Craze, NP [address] [place], [place] [address] PCP: Sandford Craze, NP   Assessment & Plan: Visit Diagnoses:  1. Displaced intraarticular fracture of left calcaneus, initial encounter for closed fracture     Plan: CT scan of foot shows a minimally displaced calcaneus fracture mainly of the posterior facet. Overall the joint is relatively congruent. I think this should do fine with nonoperative treatment. This was discussed with the patient is why. Continue nonweightbearing and follow up in 3 weeks with 2 view x-rays of the left calcaneus.  Follow-Up Instructions: Return in about 3 weeks (around 09/11/2017).   Orders:  No orders of the defined types were placed in this encounter.  No orders of the defined types were placed in this encounter.     Procedures: No procedures performed   Clinical Data: No additional findings.   Subjective: Chief Complaint  Patient presents with  . Left Foot - Pain    Patient follows up today for his calcaneus fracture. This happened 3 weeks ago and he is feeling slightly better.    Review of Systems   Objective: Vital Signs: There were no vitals taken for this visit.  Physical Exam  Ortho Exam He still does have swelling of his left foot. The skin is barely wrinkling. The foot is warm well-perfused. No fracture blisters. Specialty Comments:  No specialty comments available.  Imaging: No results found.   PMFS History: Patient Active Problem List   Diagnosis Date Noted  . Displaced intraarticular fracture of left calcaneus, initial encounter for closed fracture 08/07/2017  . Viral syndrome 12/19/2014  . Otitis media 12/19/2014  . Testicular pain, right 07/12/2012  . Hyperglycemia 03/13/2012  . HTN (hypertension) 03/13/2012  .  HYPERLIPIDEMIA 06/26/2009  . FATTY LIVER DISEASE 06/26/2009  . TRANSAMINASES, SERUM, ELEVATED 06/26/2009   Past Medical History:  Diagnosis Date  . Abnormal LFTs (liver function tests)    secondary to lovastatin and fatty liver  . Fatty liver    history of fatty liver  . Hyperlipidemia     Family History  Problem Relation Age of Onset  . Diabetes Mother   . Hypertension Mother   . Stroke Maternal Grandfather   . Coronary artery disease Maternal Uncle   . Other Neg Hx        no FH of colon cancer    No past surgical history on file. Social History   Occupational History  . Not on file.   Social History Main Topics  . Smoking status: Never Smoker  . Smokeless tobacco: Never Used  . Alcohol use No  . Drug use: No  . Sexual activity: Not on file

## 2017-09-11 ENCOUNTER — Ambulatory Visit (INDEPENDENT_AMBULATORY_CARE_PROVIDER_SITE_OTHER): Payer: BC Managed Care – PPO | Admitting: Orthopaedic Surgery

## 2017-09-15 ENCOUNTER — Encounter (INDEPENDENT_AMBULATORY_CARE_PROVIDER_SITE_OTHER): Payer: Self-pay | Admitting: Orthopaedic Surgery

## 2017-09-15 ENCOUNTER — Ambulatory Visit (INDEPENDENT_AMBULATORY_CARE_PROVIDER_SITE_OTHER): Payer: BC Managed Care – PPO

## 2017-09-15 ENCOUNTER — Ambulatory Visit (INDEPENDENT_AMBULATORY_CARE_PROVIDER_SITE_OTHER): Payer: BC Managed Care – PPO | Admitting: Orthopaedic Surgery

## 2017-09-15 DIAGNOSIS — S92062A Displaced intraarticular fracture of left calcaneus, initial encounter for closed fracture: Secondary | ICD-10-CM | POA: Diagnosis not present

## 2017-09-15 NOTE — Progress Notes (Signed)
Patient is 6 weeks status post minimally displaced calcaneus fracture. He is doing well and not reporting any pain. He does have some persistent swelling which has gotten better.  Physical exam of the left foot is relatively benign. It is not tender to palpation.  X-ray show stable alignment without any evidence of complications. Demonstrating bony consolidation.  Patient may begin to weight-bear as tolerated in a cam walker with crutches. Referral to physical therapy. Should work from home for 6 weeks. Follow-up in 6 weeks with repeat 2 view x-rays of the left calcaneus

## 2017-09-17 ENCOUNTER — Ambulatory Visit: Payer: BC Managed Care – PPO

## 2017-09-24 ENCOUNTER — Ambulatory Visit: Payer: BC Managed Care – PPO | Attending: Orthopaedic Surgery | Admitting: Physical Therapy

## 2017-09-24 ENCOUNTER — Encounter: Payer: Self-pay | Admitting: Physical Therapy

## 2017-09-24 DIAGNOSIS — M25572 Pain in left ankle and joints of left foot: Secondary | ICD-10-CM | POA: Insufficient documentation

## 2017-09-24 DIAGNOSIS — R600 Localized edema: Secondary | ICD-10-CM | POA: Diagnosis present

## 2017-09-24 DIAGNOSIS — R262 Difficulty in walking, not elsewhere classified: Secondary | ICD-10-CM

## 2017-09-24 DIAGNOSIS — M6281 Muscle weakness (generalized): Secondary | ICD-10-CM | POA: Diagnosis present

## 2017-09-24 NOTE — Therapy (Signed)
[facility] [address] [place], [place], [address] Phone: [phone]   Fax:  [phone]  Physical Therapy Evaluation  Patient Details  Name: [name] [name] MRN: [id number] Date of Birth: 1976/09/23 Referring Provider: Tarry Kos, MD  Encounter Date: 09/24/2017      PT End of Session - 09/24/17 1614    Visit Number 1   Number of Visits 21   Date for PT Re-Evaluation 12/05/17   Authorization Type [employer]- no visit limit   PT Start Time 1615   PT Stop Time 1710   PT Time Calculation (min) 55 min   Activity Tolerance Patient tolerated treatment well   Behavior During Therapy [facility] for tasks assessed/performed      Past Medical History:  Diagnosis Date  . Abnormal LFTs (liver function tests)    secondary to lovastatin and fatty liver  . Fatty liver    history of fatty liver  . Hyperlipidemia     History reviewed. No pertinent surgical history.  There were no vitals filed for this visit.       Subjective Assessment - 09/24/17 1619    Subjective R calcaneus fracture after falling from ceiling 9 weeks ago (Aug 2). Don't really feel anything unless I put weight on it,I dont think im ready for that yet. Is not aware of restrictions of wearing boot.    Patient Stated Goals walk normally, exercise-run on treadmill   Currently in Pain? No/denies            [facility] PT Assessment - 09/24/17 0001      Assessment   Medical Diagnosis Left calcaneus fx   Referring Provider Tarry Kos, MD   Onset Date/Surgical Date 07/31/17   Hand Dominance Right   Next MD Visit 11/5   Prior Therapy no     Precautions   Precautions None     Restrictions   Other Position/Activity Restrictions WBAT     Balance Screen   Has the patient fallen in the past 6 months Yes   How many times? 1   Has the patient had a decrease in activity level because of a fear of falling?  Yes   Is the patient reluctant to leave their home because of a  fear of falling?  No     Home Tourist information centre manager residence   Additional Comments stairs at home     Prior Function   Level of Independence Independent   Vocation Requirements works from home     Cognition   Overall Cognitive Status Within Functional Limits for tasks assessed     Observation/Other Assessments   Focus on Therapeutic Outcomes (FOTO)  53% limited     Observation/Other Assessments-Edema    Edema --  Significant edema noted in L foot/ankle vs R     Sensation   Additional Comments WFL     ROM / Strength   AROM / PROM / Strength AROM;PROM;Strength     AROM   AROM Assessment Site Ankle   Right/Left Ankle Left   Left Ankle Dorsiflexion -14   Left Ankle Plantar Flexion 32   Left Ankle Inversion 10   Left Ankle Eversion 4     PROM   PROM Assessment Site Ankle   Right/Left Ankle Left   Left Ankle Dorsiflexion 0   Left Ankle Plantar Flexion 32     Strength   Strength Assessment Site Ankle   Right/Left Ankle Left   Left Ankle  Dorsiflexion 3-/5   Left Ankle Plantar Flexion 3-/5   Left Ankle Inversion 4/5   Left Ankle Eversion 4/5     Palpation   Palpation comment tenderness to rest heel on table; light blue bruise noted at center of plantar aspect of foot            Objective measurements completed on examination: See above findings.          [facility] Adult PT Treatment/Exercise - 09/24/17 0001      Exercises   Exercises Ankle     Modalities   Modalities Vasopneumatic     Vasopneumatic   Number Minutes Vasopneumatic  15 minutes   Vasopnuematic Location  Ankle  L   Vasopneumatic Pressure Low   Vasopneumatic Temperature  34     Ankle Exercises: Stretches   Soleus Stretch Limitations with strap   Gastroc Stretch Limitations gastroc+hamstring with strap     Ankle Exercises: Supine   Other Supine Ankle Exercises with leg elevated: toe curls, PF/DF, ankle circles                PT Education - 09/24/17 1716     Education provided Yes   Education Details anatomy of condition, POC, HEP, exercise form/rationale, gait pattern & weening from assistance, cleaning skin   Person(s) Educated Patient   Methods Explanation;Demonstration;Tactile cues;Verbal cues;Handout   Comprehension Verbalized understanding;Returned demonstration;Verbal cues required;Tactile cues required;Need further instruction          PT Short Term Goals - 09/24/17 1705      PT SHORT TERM GOAL #1   Title DF ROM to 10 deg for necessary rage in gait pattern   Baseline see flowsheet   Time 4   Period Weeks   Status New   Target Date 10/24/17     PT SHORT TERM GOAL #2   Title Pt will be able to ambulate 100 ft comfortably with tennis shoes and without crutches, demonstrating proper gait pattern   Baseline unable at eval   Time 4   Period Weeks   Status New   Target Date 10/24/17           PT Long Term Goals - 09/24/17 1707      PT LONG TERM GOAL #1   Title Gross hip, knee and ankle strength to 5/5 for proper support to biomechanical chain   Baseline see flowsheet   Time 10   Period Weeks   Status New   Target Date 12/05/17     PT LONG TERM GOAL #2   Title Pt will be able to return to light jogging for long term exercise   Baseline unable at eval   Time 10   Period Weeks   Status New   Target Date 12/05/17     PT LONG TERM GOAL #3   Title FOTO to 31% limitation to indicate significant improvement in functional ability   Baseline 53% limited at eval   Time 10   Period Weeks   Status New   Target Date 12/05/17     PT LONG TERM GOAL #4   Title Pt will be able to ambulate all necessary community distances without limitaiton by ankle pain   Baseline unable at eval   Time 10   Period Weeks   Status New   Target Date 12/05/17                Plan - 09/24/17 1657    Clinical Impression Statement Pt presents to  PT with complaints of L heel pain s/p calcaneus fracture 9 weeks ago. Ambulating with  bilateral axillary crutches, NWB with CAM boot. Significant swelling noted resulting in poor ROM. weakness along biomechanical chain from long term lack of use. Pt will benefit from skilled PT in order to improve functional ROM, strength and meet long term goals to return to PLOF. I have asked the pt to continue using crutches but to wear tennis shoes at home and to PT to normalize patter, only wear boot if he is going out for long periods or if pain increases significantly.    History and Personal Factors relevant to plan of care: none   Clinical Presentation Stable   Clinical Decision Making Low   Rehab Potential Good   PT Frequency 2x / week   PT Duration --  10 weeks   PT Treatment/Interventions ADLs/Self Care Home Management;Cryotherapy;Electrical Stimulation;Iontophoresis /ml Dexamethasone;Functional mobility training;Stair training;Gait training;Ultrasound;Traction;Moist Heat;Therapeutic activities;Therapeutic exercise;Balance training;Neuromuscular re-education;Patient/family education;Passive range of motion;Manual techniques;Dry needling;Taping;Vasopneumatic Device   PT Next Visit Plan gait pattern with crutches-no CAM (weening from crutches, WBAT), hip strengthening, nu step   PT Home Exercise Plan toe curls, PF/DF, ankle circles, soleus stretch, gastroc+hamstring stretch, standing lateral weight shift   Consulted and Agree with Plan of Care Patient;Family member/caregiver      Patient will benefit from skilled therapeutic intervention in order to improve the following deficits and impairments:  Abnormal gait, Decreased range of motion, Difficulty walking, Increased muscle spasms, Decreased activity tolerance, Pain, Improper body mechanics, Impaired flexibility, Decreased balance, Decreased strength, Decreased mobility, Increased edema, Postural dysfunction  Visit Diagnosis: Pain in left ankle and joints of left foot - Plan: PT plan of care cert/re-cert  Difficulty in walking, not  elsewhere classified - Plan: PT plan of care cert/re-cert  Muscle weakness (generalized) - Plan: PT plan of care cert/re-cert  Localized edema - Plan: PT plan of care cert/re-cert     Problem List Patient Active Problem List   Diagnosis Date Noted  . Displaced intraarticular fracture of left calcaneus, initial encounter for closed fracture 08/07/2017  . Viral syndrome 12/19/2014  . Otitis media 12/19/2014  . Testicular pain, right 07/12/2012  . Hyperglycemia 03/13/2012  . HTN (hypertension) 03/13/2012  . HYPERLIPIDEMIA 06/26/2009  . FATTY LIVER DISEASE 06/26/2009  . TRANSAMINASES, SERUM, ELEVATED 06/26/2009  Lajuana Patchell C. Mikella Linsley PT, DPT 09/24/17 5:18 PM   [facility] [address] [place], [place], [address] Phone: [phone]   Fax:  [phone]  Name: SHERRON MUMMERT MRN: [id number] Date of Birth: 09-19-1976

## 2017-09-29 ENCOUNTER — Ambulatory Visit (INDEPENDENT_AMBULATORY_CARE_PROVIDER_SITE_OTHER): Payer: BC Managed Care – PPO | Admitting: Family

## 2017-09-29 ENCOUNTER — Encounter: Payer: Self-pay | Admitting: Family

## 2017-09-29 VITALS — BP 138/86 | HR 94 | Temp 98.4°F | Resp 18 | Ht 70.0 in

## 2017-09-29 DIAGNOSIS — E785 Hyperlipidemia, unspecified: Secondary | ICD-10-CM | POA: Diagnosis not present

## 2017-09-29 DIAGNOSIS — I10 Essential (primary) hypertension: Secondary | ICD-10-CM | POA: Diagnosis not present

## 2017-09-29 DIAGNOSIS — Z23 Encounter for immunization: Secondary | ICD-10-CM

## 2017-09-29 MED ORDER — HYDROCHLOROTHIAZIDE 25 MG PO TABS: 25.0000 mg | ORAL_TABLET | Freq: Every day | ORAL | 5 refills | Status: DC

## 2017-09-29 MED ORDER — LIVALO 4 MG PO TABS: ORAL_TABLET | ORAL | 5 refills | Status: DC

## 2017-09-29 NOTE — Progress Notes (Signed)
Subjective:    Patient ID: [name] [name], male    DOB: July 25, 1976, 41 y.o.   MRN: [id number]  HPI  Patient is a 41 year old male who presents today for follow-up.  HTN- maintained on hctz. Denies CP/SOB or swelling.   BP Readings from Last 3 Encounters:  09/29/17 138/86  07/31/17 (!) 159/87  01/08/17 (!) 136/94   Hyperlipidemia- maintained on livalo.  Reports no myalgia.  Tries to watch his diet.   Lab Results  Component Value Date   CHOL 147 01/14/2013   HDL 31.00 (L) 01/14/2013   LDLCALC 91 01/14/2013   LDLDIRECT 152.4 07/10/2010   TRIG 126.0 01/14/2013   CHOLHDL 5 01/14/2013   He reports calcaneal fracture 8/20, was in boot, now out of boot and on crutches. Working with PT.     Review of Systems See HPI  Past Medical History:  Diagnosis Date  . Abnormal LFTs (liver function tests)    secondary to lovastatin and fatty liver  . Calcaneal fracture 07/31/2017   left  . Fatty liver    history of fatty liver  . Hyperlipidemia   . Hypertension      Social History   Social History  . Marital status: Married    Spouse name: N/A  . Number of children: N/A  . Years of education: N/A   Occupational History  . Not on file.   Social History Main Topics  . Smoking status: Never Smoker  . Smokeless tobacco: Never Used  . Alcohol use No  . Drug use: No  . Sexual activity: Not on file   Other Topics Concern  . Not on file   Social History Narrative   Married - wife is NP no children   Never Smoked   Alcohol use-no    Drug use-no    Regular exercise-yes     Occupation:  Audiological scientist          History reviewed. No pertinent surgical history.  Family History  Problem Relation Age of Onset  . Diabetes Mother   . Hypertension Mother   . Stroke Maternal Grandfather   . Coronary artery disease Maternal Uncle   . Other Neg Hx        no FH of colon cancer    No Known Allergies  Current Outpatient Prescriptions on File Prior to Visit    Medication Sig Dispense Refill  . aspirin EC 81 MG tablet Take 81 mg by mouth daily.    . hydrochlorothiazide (HYDRODIURIL) 25 MG tablet TAKE ONE TABLET BY MOUTH ONCE DAILY 90 tablet 0  . LIVALO 4 MG TABS TAKE 1/2 OF A TABLET BY MOUTH EVERY DAY  3  . naproxen (NAPROSYN) 500 MG tablet Take 1 tablet (500 mg total) by mouth 2 (two) times daily with a meal. 30 tablet 0  . Omega-3 Fatty Acids (FISH OIL) 1000 MG CAPS Take 2,000 mg by mouth daily.      No current facility-administered medications on file prior to visit.     BP 138/86 (BP Location: Right Arm, Cuff Size: Large)   Pulse 94   Temp 98.4 F (36.9 C) (Oral)   Resp 18   Ht  (1.778 m)   SpO2 97%       Objective:   Physical Exam  Constitutional: He is oriented to person, place, and time. He appears well-developed and well-nourished. No distress.  HENT:  Head: Normocephalic and atraumatic.  Cardiovascular: Normal rate and regular rhythm.   No  murmur heard. Pulmonary/Chest: Effort normal and breath sounds normal. No respiratory distress. He has no wheezes. He has no rales.  Musculoskeletal: He exhibits no edema.  Neurological: He is alert and oriented to person, place, and time.  Skin: Skin is warm and dry.  Psychiatric: He has a normal mood and affect. His behavior is normal. Thought content normal.          Assessment & Plan:

## 2017-09-29 NOTE — Addendum Note (Signed)
Addended by: Mervin Kung A on: 09/29/2017 07:19 PM   Modules accepted: Orders

## 2017-09-29 NOTE — Assessment & Plan Note (Signed)
Tolerating statin, obtain follow-up lipid panel. 

## 2017-09-29 NOTE — Assessment & Plan Note (Signed)
Stable on current meds. Continue same, obtain cmet.

## 2017-09-29 NOTE — Patient Instructions (Signed)
Please complete lab work prior to leaving.   

## 2017-09-30 LAB — COMPREHENSIVE METABOLIC PANEL
ALBUMIN: 4.9 g/dL (ref 3.5–5.2)
ALT: 60 U/L — ABNORMAL HIGH (ref 0–53)
AST: 25 U/L (ref 0–37)
Alkaline Phosphatase: 42 U/L (ref 39–117)
BUN: 12 mg/dL (ref 6–23)
CALCIUM: 10.1 mg/dL (ref 8.4–10.5)
CO2: 31 mEq/L (ref 19–32)
Chloride: 100 mEq/L (ref 96–112)
Creatinine, Ser: 0.92 mg/dL (ref 0.40–1.50)
GFR: 95.67 mL/min (ref >60.00)
GLUCOSE: 97 mg/dL (ref 70–99)
POTASSIUM: 3.5 meq/L (ref 3.5–5.1)
SODIUM: 140 meq/L (ref 135–145)
TOTAL PROTEIN: 7.6 g/dL (ref 6.0–8.3)
Total Bilirubin: 1 mg/dL (ref 0.2–1.2)

## 2017-09-30 LAB — LIPID PANEL
CHOLESTEROL: 139 mg/dL (ref 0–200)
HDL: 35.2 mg/dL — ABNORMAL LOW (ref >39.00)
LDL CALC: 69 mg/dL (ref 0–99)
NonHDL: 103.8
Total CHOL/HDL Ratio: 4
Triglycerides: 172 mg/dL — ABNORMAL HIGH (ref 0.0–149.0)
VLDL: 34.4 mg/dL (ref 0.0–40.0)

## 2017-10-01 ENCOUNTER — Encounter: Payer: Self-pay | Admitting: Physical Therapy

## 2017-10-01 ENCOUNTER — Telehealth: Payer: Self-pay | Admitting: Family

## 2017-10-01 ENCOUNTER — Encounter: Payer: Self-pay | Admitting: Family

## 2017-10-01 ENCOUNTER — Ambulatory Visit: Payer: BC Managed Care – PPO | Attending: Orthopaedic Surgery | Admitting: Physical Therapy

## 2017-10-01 DIAGNOSIS — R262 Difficulty in walking, not elsewhere classified: Secondary | ICD-10-CM | POA: Diagnosis present

## 2017-10-01 DIAGNOSIS — M6281 Muscle weakness (generalized): Secondary | ICD-10-CM

## 2017-10-01 DIAGNOSIS — R600 Localized edema: Secondary | ICD-10-CM | POA: Insufficient documentation

## 2017-10-01 DIAGNOSIS — M25572 Pain in left ankle and joints of left foot: Secondary | ICD-10-CM | POA: Diagnosis present

## 2017-10-01 MED ORDER — LIVALO 4 MG PO TABS: ORAL_TABLET | ORAL | 1 refills | Status: DC

## 2017-10-01 NOTE — Therapy (Signed)
[facility] Outpatient Rehabilitation [facility] [address] [place], [place], [address] Phone: [phone]   Fax:  [phone]  Physical Therapy Treatment  Patient Details  Name: [name] [name] MRN: [id number] Date of Birth: 1976/12/19 Referring Provider: Tarry Kos, MD  Encounter Date: 10/01/2017      PT End of Session - 10/01/17 1330    Visit Number 2   Number of Visits 21   Date for PT Re-Evaluation 12/05/17   Authorization Type BCBS- no visit limit   PT Start Time 1330   PT Stop Time 1430   PT Time Calculation (min) 60 min   Activity Tolerance Patient tolerated treatment well   Behavior During Therapy [facility] for tasks assessed/performed      Past Medical History:  Diagnosis Date  . Abnormal LFTs (liver function tests)    secondary to lovastatin and fatty liver  . Calcaneal fracture 07/31/2017   left  . Fatty liver    history of fatty liver  . Hyperlipidemia   . Hypertension     History reviewed. No pertinent surgical history.  There were no vitals filed for this visit.      Subjective Assessment - 10/01/17 1330    Subjective Pt denies pain. Wants to try to put weight but is nervous about it. Not wearing boot, wearing tennis shoes & using bilateral axillary crutches.    Patient Stated Goals walk normally, exercise-run on treadmill   Currently in Pain? No/denies                         [facility] Adult PT Treatment/Exercise - 10/01/17 0001      Exercises   Exercises Knee/Hip     Knee/Hip Exercises: Supine   Short Arc Quad Sets 20 reps   Short Arc Quad Sets Limitations 3#   Straight Leg Raise with External Rotation 20 reps   Straight Leg Raise with External Rotation Limitations 3#     Knee/Hip Exercises: Sidelying   Hip ABduction 20 reps   Hip ABduction Limitations 3#     Knee/Hip Exercises: Prone   Hip Extension Limitations prone hip ext with iso HS curl     Vasopneumatic   Number Minutes Vasopneumatic  15 minutes   Vasopnuematic Location  Ankle   Vasopneumatic Pressure Low   Vasopneumatic Temperature  34     Ankle Exercises: Aerobic   Stationary Bike nu step 5 min L5     Ankle Exercises: Standing   Other Standing Ankle Exercises gait training- complete stance phase     Ankle Exercises: Supine   T-Band 4-way     Ankle Exercises: Seated   Heel Raises 20 reps  heel/toe raises   BAPS Level 2   Other Seated Ankle Exercises long sitting DF black tband, eccentric control + overpressure                  PT Short Term Goals - 09/24/17 1705      PT SHORT TERM GOAL #1   Title DF ROM to 10 deg for necessary rage in gait pattern   Baseline see flowsheet   Time 4   Period Weeks   Status New   Target Date 10/24/17     PT SHORT TERM GOAL #2   Title Pt will be able to ambulate 100 ft comfortably with tennis shoes and without crutches, demonstrating proper gait pattern   Baseline unable at eval   Time 4   Period Weeks  Status New   Target Date 10/24/17           PT Long Term Goals - 09/24/17 1707      PT LONG TERM GOAL #1   Title Gross hip, knee and ankle strength to 5/5 for proper support to biomechanical chain   Baseline see flowsheet   Time 10   Period Weeks   Status New   Target Date 12/05/17     PT LONG TERM GOAL #2   Title Pt will be able to return to light jogging for long term exercise   Baseline unable at eval   Time 10   Period Weeks   Status New   Target Date 12/05/17     PT LONG TERM GOAL #3   Title FOTO to 31% limitation to indicate significant improvement in functional ability   Baseline 53% limited at eval   Time 10   Period Weeks   Status New   Target Date 12/05/17     PT LONG TERM GOAL #4   Title Pt will be able to ambulate all necessary community distances without limitaiton by ankle pain   Baseline unable at eval   Time 10   Period Weeks   Status New   Target Date 12/05/17               Plan - 10/01/17 1417    Clinical  Impression Statement Tactile cuing to complete stance phase and encourage hip extension of LLE. No increase in pain but could feel work in ankle. Poor coordination on BAPS and was challenged by hip strengthening   PT Treatment/Interventions ADLs/Self Care Home Management;Cryotherapy;Electrical Stimulation;Iontophoresis /ml Dexamethasone;Functional mobility training;Stair training;Gait training;Ultrasound;Traction;Moist Heat;Therapeutic activities;Therapeutic exercise;Balance training;Neuromuscular re-education;Patient/family education;Passive range of motion;Manual techniques;Dry needling;Taping;Vasopneumatic Device   PT Next Visit Plan gait pattern with crutches-no CAM (weening from crutches, WBAT), hip strengthening, nu step   PT Home Exercise Plan toe curls, PF/DF, ankle circles, soleus stretch, gastroc+hamstring stretch, standing lateral weight shift; hip ext with HS curl, hip abd, SLR with ER   Consulted and Agree with Plan of Care Patient      Patient will benefit from skilled therapeutic intervention in order to improve the following deficits and impairments:  Abnormal gait, Decreased range of motion, Difficulty walking, Increased muscle spasms, Decreased activity tolerance, Pain, Improper body mechanics, Impaired flexibility, Decreased balance, Decreased strength, Decreased mobility, Increased edema, Postural dysfunction  Visit Diagnosis: Pain in left ankle and joints of left foot  Difficulty in walking, not elsewhere classified  Muscle weakness (generalized)  Localized edema     Problem List Patient Active Problem List   Diagnosis Date Noted  . Displaced intraarticular fracture of left calcaneus, initial encounter for closed fracture 08/07/2017  . Viral syndrome 12/19/2014  . Otitis media 12/19/2014  . Testicular pain, right 07/12/2012  . Hyperglycemia 03/13/2012  . HTN (hypertension) 03/13/2012  . Hyperlipidemia 06/26/2009  . FATTY LIVER DISEASE 06/26/2009  .  TRANSAMINASES, SERUM, ELEVATED 06/26/2009    Yvan Dority C. Keshonna Valvo PT, DPT 10/01/17 3:02 PM   [facility] [address] [place], [place], [address] Phone: [phone]   Fax:  [phone]  Name: KHOA OPDAHL MRN: [id number] Date of Birth: 1976-06-07

## 2017-10-01 NOTE — Telephone Encounter (Signed)
Pt called in and requested a 90 day supply on medication LIVALO 4 MG TABS

## 2017-10-01 NOTE — Telephone Encounter (Signed)
Refill sent as requested below. Message sent to pt.

## 2017-10-03 ENCOUNTER — Ambulatory Visit: Payer: BC Managed Care – PPO | Admitting: Physical Therapy

## 2017-10-03 ENCOUNTER — Encounter: Payer: Self-pay | Admitting: Physical Therapy

## 2017-10-03 DIAGNOSIS — R262 Difficulty in walking, not elsewhere classified: Secondary | ICD-10-CM

## 2017-10-03 DIAGNOSIS — M25572 Pain in left ankle and joints of left foot: Secondary | ICD-10-CM

## 2017-10-03 DIAGNOSIS — M6281 Muscle weakness (generalized): Secondary | ICD-10-CM

## 2017-10-03 DIAGNOSIS — R600 Localized edema: Secondary | ICD-10-CM

## 2017-10-03 NOTE — Therapy (Signed)
[facility] [address] [place], [place], [address] Phone: [phone]   Fax:  [phone]  Physical Therapy Treatment  Patient Details  Name: [name] [name] MRN: [id number] Date of Birth: 1976-06-13 Referring Provider: Tarry Kos, MD  Encounter Date: 10/03/2017      PT End of Session - 10/03/17 0936    Visit Number 3   Number of Visits 21   Date for PT Re-Evaluation 12/05/17   Authorization Type BCBS- no visit limit   PT Start Time 0933   PT Stop Time 1027   PT Time Calculation (min) 54 min   Activity Tolerance Patient tolerated treatment well   Behavior During Therapy [facility] for tasks assessed/performed      Past Medical History:  Diagnosis Date  . Abnormal LFTs (liver function tests)    secondary to lovastatin and fatty liver  . Calcaneal fracture 07/31/2017   left  . Fatty liver    history of fatty liver  . Hyperlipidemia   . Hypertension     History reviewed. No pertinent surgical history.  There were no vitals filed for this visit.      Subjective Assessment - 10/03/17 0936    Subjective Pt reports feeling okay today, was sore after last visit.    Patient Stated Goals walk normally, exercise-run on treadmill   Currently in Pain? No/denies                         [facility] Adult PT Treatment/Exercise - 10/03/17 0001      Knee/Hip Exercises: Aerobic   Nustep 5 min L4     Knee/Hip Exercises: Standing   Gait Training single leg on treadmil belt 5 min; with bilat crutches, increased WB; in parallel bars fwd & retro     Vasopneumatic   Number Minutes Vasopneumatic  15 minutes   Vasopnuematic Location  Ankle   Vasopneumatic Pressure Low   Vasopneumatic Temperature  34     Manual Therapy   Manual Therapy Joint mobilization;Passive ROM   Joint Mobilization end range DF AP talocrural mobs   Passive ROM DF ROM     Ankle Exercises: Seated   Other Seated Ankle Exercises long board heel/toe  taps                  PT Short Term Goals - 09/24/17 1705      PT SHORT TERM GOAL #1   Title DF ROM to 10 deg for necessary rage in gait pattern   Baseline see flowsheet   Time 4   Period Weeks   Status New   Target Date 10/24/17     PT SHORT TERM GOAL #2   Title Pt will be able to ambulate 100 ft comfortably with tennis shoes and without crutches, demonstrating proper gait pattern   Baseline unable at eval   Time 4   Period Weeks   Status New   Target Date 10/24/17           PT Long Term Goals - 09/24/17 1707      PT LONG TERM GOAL #1   Title Gross hip, knee and ankle strength to 5/5 for proper support to biomechanical chain   Baseline see flowsheet   Time 10   Period Weeks   Status New   Target Date 12/05/17     PT LONG TERM GOAL #2   Title Pt will be able to return to light jogging for  long term exercise   Baseline unable at eval   Time 10   Period Weeks   Status New   Target Date 12/05/17     PT LONG TERM GOAL #3   Title FOTO to 31% limitation to indicate significant improvement in functional ability   Baseline 53% limited at eval   Time 10   Period Weeks   Status New   Target Date 12/05/17     PT LONG TERM GOAL #4   Title Pt will be able to ambulate all necessary community distances without limitaiton by ankle pain   Baseline unable at eval   Time 10   Period Weeks   Status New   Target Date 12/05/17               Plan - 10/03/17 1013    Clinical Impression Statement Appointment today to focus on normalizing gait pattern so pt is able to begin weight bearing appropraitely. Was able to achieve hip extension rather than a quick bend of the knee to reduce DF demand. Will train with 1 crutch next week to continue weening.    PT Treatment/Interventions ADLs/Self Care Home Management;Cryotherapy;Electrical Stimulation;Iontophoresis /ml Dexamethasone;Functional mobility training;Stair training;Gait training;Ultrasound;Traction;Moist  Heat;Therapeutic activities;Therapeutic exercise;Balance training;Neuromuscular re-education;Patient/family education;Passive range of motion;Manual techniques;Dry needling;Taping;Vasopneumatic Device   PT Next Visit Plan gait with single crutch/parallel bars   PT Home Exercise Plan toe curls, PF/DF, ankle circles, soleus stretch, gastroc+hamstring stretch, standing lateral weight shift; hip ext with HS curl, hip abd, SLR with ER   Consulted and Agree with Plan of Care Patient      Patient will benefit from skilled therapeutic intervention in order to improve the following deficits and impairments:  Abnormal gait, Decreased range of motion, Difficulty walking, Increased muscle spasms, Decreased activity tolerance, Pain, Improper body mechanics, Impaired flexibility, Decreased balance, Decreased strength, Decreased mobility, Increased edema, Postural dysfunction  Visit Diagnosis: Pain in left ankle and joints of left foot  Difficulty in walking, not elsewhere classified  Muscle weakness (generalized)  Localized edema     Problem List Patient Active Problem List   Diagnosis Date Noted  . Displaced intraarticular fracture of left calcaneus, initial encounter for closed fracture 08/07/2017  . Viral syndrome 12/19/2014  . Otitis media 12/19/2014  . Testicular pain, right 07/12/2012  . Hyperglycemia 03/13/2012  . HTN (hypertension) 03/13/2012  . Hyperlipidemia 06/26/2009  . FATTY LIVER DISEASE 06/26/2009  . TRANSAMINASES, SERUM, ELEVATED 06/26/2009    Marigny Borre C. Lamyia Cdebaca PT, DPT 10/03/17 11:48 AM   [facility] [address] [place], [place], [address] Phone: [phone]   Fax:  [phone]  Name: [name] [name] MRN: [id number] Date of Birth: 02-21-1976

## 2017-10-06 ENCOUNTER — Encounter: Payer: Self-pay | Admitting: Physical Therapy

## 2017-10-06 ENCOUNTER — Ambulatory Visit: Payer: BC Managed Care – PPO | Admitting: Physical Therapy

## 2017-10-06 DIAGNOSIS — R262 Difficulty in walking, not elsewhere classified: Secondary | ICD-10-CM

## 2017-10-06 DIAGNOSIS — M6281 Muscle weakness (generalized): Secondary | ICD-10-CM

## 2017-10-06 DIAGNOSIS — R600 Localized edema: Secondary | ICD-10-CM

## 2017-10-06 DIAGNOSIS — M25572 Pain in left ankle and joints of left foot: Secondary | ICD-10-CM | POA: Diagnosis not present

## 2017-10-06 NOTE — Therapy (Signed)
[facility] Outpatient Rehabilitation [facility] [address] [place], [place], [address] Phone: [phone]   Fax:  [phone]  Physical Therapy Treatment  Patient Details  Name: [name] [name] MRN: [id number] Date of Birth: 1976-11-16 Referring Provider: Tarry Kos, MD  Encounter Date: 10/06/2017      PT End of Session - 10/06/17 1413    Visit Number 4   Number of Visits 21   Date for PT Re-Evaluation 12/05/17   Authorization Type BCBS- no visit limit   PT Start Time 1415   PT Stop Time 1513   PT Time Calculation (min) 58 min   Activity Tolerance Patient limited by pain   Behavior During Therapy [facility] for tasks assessed/performed      Past Medical History:  Diagnosis Date  . Abnormal LFTs (liver function tests)    secondary to lovastatin and fatty liver  . Calcaneal fracture 07/31/2017   left  . Fatty liver    history of fatty liver  . Hyperlipidemia   . Hypertension     History reviewed. No pertinent surgical history.  There were no vitals filed for this visit.      Subjective Assessment - 10/06/17 1413    Subjective A little soreness when placing weight through foot. notices a lot of swelling.    Patient Stated Goals walk normally, exercise-run on treadmill                         [facility] Adult PT Treatment/Exercise - 10/06/17 0001      Knee/Hip Exercises: Stretches   Gastroc Stretch 2 reps;30 seconds   Gastroc Stretch Limitations with towel     Knee/Hip Exercises: Aerobic   Nustep 5 min L5, LE only     Knee/Hip Exercises: Standing   Gait Training with single crutch & bil crutches   Other Standing Knee Exercises weight shift fwd/back & lateral on airex     Knee/Hip Exercises: Supine   Single Leg Bridge Left;20 reps     Knee/Hip Exercises: Sidelying   Hip ABduction 20 reps;10 reps     Vasopneumatic   Number Minutes Vasopneumatic  15 minutes   Vasopnuematic Location  Ankle   Vasopneumatic Pressure Low   Vasopneumatic Temperature  34     Ankle Exercises: Standing   Other Standing Ankle Exercises L foot on wobble board                  PT Short Term Goals - 09/24/17 1705      PT SHORT TERM GOAL #1   Title DF ROM to 10 deg for necessary rage in gait pattern   Baseline see flowsheet   Time 4   Period Weeks   Status New   Target Date 10/24/17     PT SHORT TERM GOAL #2   Title Pt will be able to ambulate 100 ft comfortably with tennis shoes and without crutches, demonstrating proper gait pattern   Baseline unable at eval   Time 4   Period Weeks   Status New   Target Date 10/24/17           PT Long Term Goals - 09/24/17 1707      PT LONG TERM GOAL #1   Title Gross hip, knee and ankle strength to 5/5 for proper support to biomechanical chain   Baseline see flowsheet   Time 10   Period Weeks   Status New   Target Date 12/05/17  PT LONG TERM GOAL #2   Title Pt will be able to return to light jogging for long term exercise   Baseline unable at eval   Time 10   Period Weeks   Status New   Target Date 12/05/17     PT LONG TERM GOAL #3   Title FOTO to 31% limitation to indicate significant improvement in functional ability   Baseline 53% limited at eval   Time 10   Period Weeks   Status New   Target Date 12/05/17     PT LONG TERM GOAL #4   Title Pt will be able to ambulate all necessary community distances without limitaiton by ankle pain   Baseline unable at eval   Time 10   Period Weeks   Status New   Target Date 12/05/17               Plan - 10/06/17 1428    Clinical Impression Statement Pt arrived to PT presenting with a quick R knee bend in stance phase resulting in lack of hip extension that we worked on last visit. Pt reports he is limited by fear and pain in his heel when he steps on it. When walking wiht one crutch pt would leave weight in ball of foot rather than stepping through heel. Was able to demo R step past L with single  crutch with cuing. I asked him to practice wiht single crutch at home and continue stretching.    PT Treatment/Interventions ADLs/Self Care Home Management;Cryotherapy;Electrical Stimulation;Iontophoresis /ml Dexamethasone;Functional mobility training;Stair training;Gait training;Ultrasound;Traction;Moist Heat;Therapeutic activities;Therapeutic exercise;Balance training;Neuromuscular re-education;Patient/family education;Passive range of motion;Manual techniques;Dry needling;Taping;Vasopneumatic Device   PT Next Visit Plan gait training, stretching DF, review ankle 4-way   PT Home Exercise Plan toe curls, PF/DF, ankle circles, soleus stretch, gastroc+hamstring stretch, standing lateral weight shift; hip ext with HS curl, hip abd, SLR with ER   Consulted and Agree with Plan of Care Patient      Patient will benefit from skilled therapeutic intervention in order to improve the following deficits and impairments:  Abnormal gait, Decreased range of motion, Difficulty walking, Increased muscle spasms, Decreased activity tolerance, Pain, Improper body mechanics, Impaired flexibility, Decreased balance, Decreased strength, Decreased mobility, Increased edema, Postural dysfunction  Visit Diagnosis: Pain in left ankle and joints of left foot  Difficulty in walking, not elsewhere classified  Muscle weakness (generalized)  Localized edema     Problem List Patient Active Problem List   Diagnosis Date Noted  . Displaced intraarticular fracture of left calcaneus, initial encounter for closed fracture 08/07/2017  . Viral syndrome 12/19/2014  . Otitis media 12/19/2014  . Testicular pain, right 07/12/2012  . Hyperglycemia 03/13/2012  . HTN (hypertension) 03/13/2012  . Hyperlipidemia 06/26/2009  . FATTY LIVER DISEASE 06/26/2009  . TRANSAMINASES, SERUM, ELEVATED 06/26/2009    Shonette Rhames C. Henery Betzold PT, DPT 10/06/17 3:01 PM   [facility] [address] [place], [place], [address] Phone: [phone]   Fax:  [phone]  Name: NEAMIAH SCIARRA MRN: [id number] Date of Birth: 16-Jun-1976

## 2017-10-08 ENCOUNTER — Ambulatory Visit: Payer: BC Managed Care – PPO | Admitting: Physical Therapy

## 2017-10-08 ENCOUNTER — Encounter: Payer: Self-pay | Admitting: Physical Therapy

## 2017-10-08 DIAGNOSIS — M25572 Pain in left ankle and joints of left foot: Secondary | ICD-10-CM

## 2017-10-08 DIAGNOSIS — R600 Localized edema: Secondary | ICD-10-CM

## 2017-10-08 DIAGNOSIS — M6281 Muscle weakness (generalized): Secondary | ICD-10-CM

## 2017-10-08 DIAGNOSIS — R262 Difficulty in walking, not elsewhere classified: Secondary | ICD-10-CM

## 2017-10-08 NOTE — Therapy (Signed)
[facility] Outpatient Rehabilitation [facility] [address] [place], [place], [address] Phone: [phone]   Fax:  [phone]  Physical Therapy Treatment  Patient Details  Name: [name] [name] MRN: [id number] Date of Birth: 1976/12/21 Referring Provider: Tarry Kos, MD  Encounter Date: 10/08/2017      PT End of Session - 10/08/17 1406    Visit Number 5   Number of Visits 21   Date for PT Re-Evaluation 12/05/17   Authorization Type BCBS- no visit limit   PT Start Time 1406   PT Stop Time 1505   PT Time Calculation (min) 59 min   Activity Tolerance Patient tolerated treatment well;Patient limited by pain   Behavior During Therapy [facility] for tasks assessed/performed      Past Medical History:  Diagnosis Date  . Abnormal LFTs (liver function tests)    secondary to lovastatin and fatty liver  . Calcaneal fracture 07/31/2017   left  . Fatty liver    history of fatty liver  . Hyperlipidemia   . Hypertension     History reviewed. No pertinent surgical history.  There were no vitals filed for this visit.      Subjective Assessment - 10/08/17 1406    Subjective reports feeling okay, ready to walk. Did not try walking with single crutch since last visit.    Patient Stated Goals walk normally, exercise-run on treadmill   Currently in Pain? No/denies            [facility] PT Assessment - 10/08/17 0001      AROM   Left Ankle Dorsiflexion -2     PROM   Left Ankle Dorsiflexion 6  after mobilizations                     [facility] Adult PT Treatment/Exercise - 10/08/17 0001      Knee/Hip Exercises: Aerobic   Nustep 6 min L5 LE only     Knee/Hip Exercises: Standing   Gait Training with bilateral crutches   Other Standing Knee Exercises weight shift lateral in parallel bars     Vasopneumatic   Number Minutes Vasopneumatic  15 minutes   Vasopnuematic Location  Ankle   Vasopneumatic Pressure Low   Vasopneumatic Temperature  34     Manual  Therapy   Joint Mobilization end range DF AP talocrural mobs; distraction   Passive ROM passive stretching 4-way     Ankle Exercises: Supine   T-Band red 4-way     Ankle Exercises: Stretches   Gastroc Stretch Limitations gastroc+hamstring with strap     Ankle Exercises: Seated   Other Seated Ankle Exercises long board heel/toe taps & lateral                  PT Short Term Goals - 09/24/17 1705      PT SHORT TERM GOAL #1   Title DF ROM to 10 deg for necessary rage in gait pattern   Baseline see flowsheet   Time 4   Period Weeks   Status New   Target Date 10/24/17     PT SHORT TERM GOAL #2   Title Pt will be able to ambulate 100 ft comfortably with tennis shoes and without crutches, demonstrating proper gait pattern   Baseline unable at eval   Time 4   Period Weeks   Status New   Target Date 10/24/17           PT Long Term Goals - 09/24/17 1707  PT LONG TERM GOAL #1   Title Gross hip, knee and ankle strength to 5/5 for proper support to biomechanical chain   Baseline see flowsheet   Time 10   Period Weeks   Status New   Target Date 12/05/17     PT LONG TERM GOAL #2   Title Pt will be able to return to light jogging for long term exercise   Baseline unable at eval   Time 10   Period Weeks   Status New   Target Date 12/05/17     PT LONG TERM GOAL #3   Title FOTO to 31% limitation to indicate significant improvement in functional ability   Baseline 53% limited at eval   Time 10   Period Weeks   Status New   Target Date 12/05/17     PT LONG TERM GOAL #4   Title Pt will be able to ambulate all necessary community distances without limitaiton by ankle pain   Baseline unable at eval   Time 10   Period Weeks   Status New   Target Date 12/05/17               Plan - 10/08/17 1452    Clinical Impression Statement Pt did not tolerate weight shifting into L foot as well today, reported pain. Was able to comfortably shift weight into  foot with hand on bar even without placing weight through arm, he agrees that he is nervous that his foot is not ready. I asked him to continue using two crutches and continue to focus on gait pattern and gradually increasing weight bearing. Mild swelling noted but pt denied pain when foot was off of the floor. Notable improvement in available DF ROM both active & passive.    PT Treatment/Interventions ADLs/Self Care Home Management;Cryotherapy;Electrical Stimulation;Iontophoresis /ml Dexamethasone;Functional mobility training;Stair training;Gait training;Ultrasound;Traction;Moist Heat;Therapeutic activities;Therapeutic exercise;Balance training;Neuromuscular re-education;Patient/family education;Passive range of motion;Manual techniques;Dry needling;Taping;Vasopneumatic Device   PT Next Visit Plan ankle mobs PRN, single crutch gait training   PT Home Exercise Plan toe curls, PF/DF, ankle circles, soleus stretch, gastroc+hamstring stretch, standing lateral weight shift; hip ext with HS curl, hip abd, SLR with ER   Consulted and Agree with Plan of Care Patient      Patient will benefit from skilled therapeutic intervention in order to improve the following deficits and impairments:  Abnormal gait, Decreased range of motion, Difficulty walking, Increased muscle spasms, Decreased activity tolerance, Pain, Improper body mechanics, Impaired flexibility, Decreased balance, Decreased strength, Decreased mobility, Increased edema, Postural dysfunction  Visit Diagnosis: Pain in left ankle and joints of left foot  Difficulty in walking, not elsewhere classified  Muscle weakness (generalized)  Localized edema     Problem List Patient Active Problem List   Diagnosis Date Noted  . Displaced intraarticular fracture of left calcaneus, initial encounter for closed fracture 08/07/2017  . Viral syndrome 12/19/2014  . Otitis media 12/19/2014  . Testicular pain, right 07/12/2012  . Hyperglycemia  03/13/2012  . HTN (hypertension) 03/13/2012  . Hyperlipidemia 06/26/2009  . FATTY LIVER DISEASE 06/26/2009  . TRANSAMINASES, SERUM, ELEVATED 06/26/2009   Mirissa Lopresti C. Safiya Girdler PT, DPT 10/08/17 2:55 PM   [facility] Outpatient Rehabilitation [facility] [address] [place], [place], [address] Phone: [phone]   Fax:  [phone]  Name: [name] [name] MRN: [id number] Date of Birth: 1976-09-13

## 2017-10-13 ENCOUNTER — Ambulatory Visit: Payer: BC Managed Care – PPO | Admitting: Physical Therapy

## 2017-10-13 ENCOUNTER — Encounter: Payer: Self-pay | Admitting: Physical Therapy

## 2017-10-13 DIAGNOSIS — M25572 Pain in left ankle and joints of left foot: Secondary | ICD-10-CM

## 2017-10-13 DIAGNOSIS — R600 Localized edema: Secondary | ICD-10-CM

## 2017-10-13 DIAGNOSIS — R262 Difficulty in walking, not elsewhere classified: Secondary | ICD-10-CM

## 2017-10-13 DIAGNOSIS — M6281 Muscle weakness (generalized): Secondary | ICD-10-CM

## 2017-10-13 NOTE — Therapy (Signed)
[facility] Outpatient Rehabilitation [facility] [address] [place], [place], [address] Phone: [phone]   Fax:  [phone]  Physical Therapy Treatment  Patient Details  Name: [name] [name] MRN: [id number] Date of Birth: 08/25/1976 Referring Provider: Tarry Kos, MD  Encounter Date: 10/13/2017      PT End of Session - 10/13/17 1501    Visit Number 6   Number of Visits 21   Date for PT Re-Evaluation 12/05/17   Authorization Type BCBS- no visit limit   PT Start Time 1500   PT Stop Time 1558   PT Time Calculation (min) 58 min   Activity Tolerance Patient tolerated treatment well   Behavior During Therapy [facility] for tasks assessed/performed      Past Medical History:  Diagnosis Date  . Abnormal LFTs (liver function tests)    secondary to lovastatin and fatty liver  . Calcaneal fracture 07/31/2017   left  . Fatty liver    history of fatty liver  . Hyperlipidemia   . Hypertension     History reviewed. No pertinent surgical history.  There were no vitals filed for this visit.      Subjective Assessment - 10/13/17 1502    Subjective Feels slight pain when doing too much. Has been trying to increase weight bearing and doing exercises.    Patient Stated Goals walk normally, exercise-run on treadmill   Currently in Pain? No/denies                         [facility] Adult PT Treatment/Exercise - 10/13/17 0001      Knee/Hip Exercises: Aerobic   Nustep 5 min L7 LE only     Knee/Hip Exercises: Supine   Bridges Limitations x20   Single Leg Bridge Left;20 reps     Knee/Hip Exercises: Sidelying   Hip ABduction Limitations 90/90 lift to kick     Vasopneumatic   Number Minutes Vasopneumatic  15 minutes   Vasopnuematic Location  Ankle   Vasopneumatic Pressure Low   Vasopneumatic Temperature  34     Ankle Exercises: Seated   Toe Raise 20 reps  slow, eccentric lower     Ankle Exercises: Standing   Other Standing Ankle Exercises gait  training in parallel bars                  PT Short Term Goals - 09/24/17 1705      PT SHORT TERM GOAL #1   Title DF ROM to 10 deg for necessary rage in gait pattern   Baseline see flowsheet   Time 4   Period Weeks   Status New   Target Date 10/24/17     PT SHORT TERM GOAL #2   Title Pt will be able to ambulate 100 ft comfortably with tennis shoes and without crutches, demonstrating proper gait pattern   Baseline unable at eval   Time 4   Period Weeks   Status New   Target Date 10/24/17           PT Long Term Goals - 09/24/17 1707      PT LONG TERM GOAL #1   Title Gross hip, knee and ankle strength to 5/5 for proper support to biomechanical chain   Baseline see flowsheet   Time 10   Period Weeks   Status New   Target Date 12/05/17     PT LONG TERM GOAL #2   Title Pt will be able to return  to light jogging for long term exercise   Baseline unable at eval   Time 10   Period Weeks   Status New   Target Date 12/05/17     PT LONG TERM GOAL #3   Title FOTO to 31% limitation to indicate significant improvement in functional ability   Baseline 53% limited at eval   Time 10   Period Weeks   Status New   Target Date 12/05/17     PT LONG TERM GOAL #4   Title Pt will be able to ambulate all necessary community distances without limitaiton by ankle pain   Baseline unable at eval   Time 10   Period Weeks   Status New   Target Date 12/05/17               Plan - 10/13/17 1527    Clinical Impression Statement Pt able to demo proper gait pattern without the use of UE along length of parallel bars today. Cues required for heel-toe pattern, pt only reported heel pain when striking flat foot. I asked pt to practice walking without crutches at home with correct heel-toe pattern.    PT Treatment/Interventions ADLs/Self Care Home Management;Cryotherapy;Electrical Stimulation;Iontophoresis /ml Dexamethasone;Functional mobility training;Stair training;Gait  training;Ultrasound;Traction;Moist Heat;Therapeutic activities;Therapeutic exercise;Balance training;Neuromuscular re-education;Patient/family education;Passive range of motion;Manual techniques;Dry needling;Taping;Vasopneumatic Device   PT Next Visit Plan ankle mobs PRN, gait training   PT Home Exercise Plan toe curls, PF/DF, ankle circles, soleus stretch, gastroc+hamstring stretch, standing lateral weight shift; hip ext with HS curl, hip abd, SLR with ER   Consulted and Agree with Plan of Care Patient      Patient will benefit from skilled therapeutic intervention in order to improve the following deficits and impairments:  Abnormal gait, Decreased range of motion, Difficulty walking, Increased muscle spasms, Decreased activity tolerance, Pain, Improper body mechanics, Impaired flexibility, Decreased balance, Decreased strength, Decreased mobility, Increased edema, Postural dysfunction  Visit Diagnosis: Pain in left ankle and joints of left foot  Difficulty in walking, not elsewhere classified  Muscle weakness (generalized)  Localized edema     Problem List Patient Active Problem List   Diagnosis Date Noted  . Displaced intraarticular fracture of left calcaneus, initial encounter for closed fracture 08/07/2017  . Viral syndrome 12/19/2014  . Otitis media 12/19/2014  . Testicular pain, right 07/12/2012  . Hyperglycemia 03/13/2012  . HTN (hypertension) 03/13/2012  . Hyperlipidemia 06/26/2009  . FATTY LIVER DISEASE 06/26/2009  . TRANSAMINASES, SERUM, ELEVATED 06/26/2009    Desi Rowe C. Tsugio Elison PT, DPT 10/13/17 3:45 PM   [facility] Outpatient Rehabilitation [facility] [address] [place], [place], [address] Phone: [phone]   Fax:  [phone]  Name: [name] [name] MRN: [id number] Date of Birth: [birth year]

## 2017-10-15 ENCOUNTER — Encounter: Payer: Self-pay | Admitting: Physical Therapy

## 2017-10-15 ENCOUNTER — Ambulatory Visit: Payer: BC Managed Care – PPO | Admitting: Physical Therapy

## 2017-10-15 DIAGNOSIS — M25572 Pain in left ankle and joints of left foot: Secondary | ICD-10-CM | POA: Diagnosis not present

## 2017-10-15 DIAGNOSIS — R600 Localized edema: Secondary | ICD-10-CM

## 2017-10-15 DIAGNOSIS — R262 Difficulty in walking, not elsewhere classified: Secondary | ICD-10-CM

## 2017-10-15 DIAGNOSIS — M6281 Muscle weakness (generalized): Secondary | ICD-10-CM

## 2017-10-15 NOTE — Therapy (Signed)
[facility] Outpatient Rehabilitation [facility] [address] [place], [place], [address] Phone: [phone]   Fax:  [phone]  Physical Therapy Treatment  Patient Details  Name: CHICO CAWOOD MRN: [id number] Date of Birth: [birth year] Referring Provider: Tarry Kos, MD  Encounter Date: 10/15/2017      PT End of Session - 10/15/17 1544    Visit Number 7   Number of Visits 21   Date for PT Re-Evaluation 12/05/17   Authorization Type BCBS- no visit limit   PT Start Time 1545   PT Stop Time 1641   PT Time Calculation (min) 56 min   Activity Tolerance Patient tolerated treatment well   Behavior During Therapy [facility] for tasks assessed/performed      Past Medical History:  Diagnosis Date  . Abnormal LFTs (liver function tests)    secondary to lovastatin and fatty liver  . Calcaneal fracture 07/31/2017   left  . Fatty liver    history of fatty liver  . Hyperlipidemia   . Hypertension     History reviewed. No pertinent surgical history.  There were no vitals filed for this visit.      Subjective Assessment - 10/15/17 1545    Subjective Pt reports some soreness after last visit. "I am too chicken to walk at home"  Still feeling discomfort with pressure to heel.    Patient Stated Goals walk normally, exercise-run on treadmill   Currently in Pain? No/denies                         [facility] Adult PT Treatment/Exercise - 10/15/17 0001      Knee/Hip Exercises: Aerobic   Nustep 5 min L5 Le only     Manual Therapy   Joint Mobilization talocrural distraction, AP mobs at end range DF     Ankle Exercises: Stretches   Gastroc Stretch Limitations gastroc+hamstring with strap     Ankle Exercises: Seated   Ankle Circles/Pumps Other (comment);AROM  full range PF/DF in long sitting   [place] Pickup 1 cup   Other Seated Ankle Exercises rocker board PF/DF, inversion/eversion   Other Seated Ankle Exercises towel slide with overpressure from R foot      Ankle Exercises: Standing   Heel Raises Other (comment)  bilateral heel/toe rocking   Other Standing Ankle Exercises gait training in parallel bars                  PT Short Term Goals - 09/24/17 1705      PT SHORT TERM GOAL #1   Title DF ROM to 10 deg for necessary rage in gait pattern   Baseline see flowsheet   Time 4   Period Weeks   Status New   Target Date 10/24/17     PT SHORT TERM GOAL #2   Title Pt will be able to ambulate 100 ft comfortably with tennis shoes and without crutches, demonstrating proper gait pattern   Baseline unable at eval   Time 4   Period Weeks   Status New   Target Date 10/24/17           PT Long Term Goals - 09/24/17 1707      PT LONG TERM GOAL #1   Title Gross hip, knee and ankle strength to 5/5 for proper support to biomechanical chain   Baseline see flowsheet   Time 10   Period Weeks   Status New   Target Date 12/05/17  PT LONG TERM GOAL #2   Title Pt will be able to return to light jogging for long term exercise   Baseline unable at eval   Time 10   Period Weeks   Status New   Target Date 12/05/17     PT LONG TERM GOAL #3   Title FOTO to 31% limitation to indicate significant improvement in functional ability   Baseline 53% limited at eval   Time 10   Period Weeks   Status New   Target Date 12/05/17     PT LONG TERM GOAL #4   Title Pt will be able to ambulate all necessary community distances without limitaiton by ankle pain   Baseline unable at eval   Time 10   Period Weeks   Status New   Target Date 12/05/17               Plan - 10/15/17 1559    Clinical Impression Statement DF ROM improved form 6 to 10 deg following mobilizations and pt verbalized resolution of anterior ankle pain at end range. Continues to have pain when walking without AD. L lateral calcaneus pain reported today. Encouraged pt to continue with stretches, exercises & weight bearing at home.    PT Treatment/Interventions  ADLs/Self Care Home Management;Cryotherapy;Electrical Stimulation;Iontophoresis 4mg /ml Dexamethasone;Functional mobility training;Stair training;Gait training;Ultrasound;Traction;Moist Heat;Therapeutic activities;Therapeutic exercise;Balance training;Neuromuscular re-education;Patient/family education;Passive range of motion;Manual techniques;Dry needling;Taping;Vasopneumatic Device   PT Next Visit Plan ankle mobs PRN, gait training   PT Home Exercise Plan toe curls, PF/DF, ankle circles, soleus stretch, gastroc+hamstring stretch, standing lateral weight shift; hip ext with HS curl, hip abd, SLR with ER   Consulted and Agree with Plan of Care Patient      Patient will benefit from skilled therapeutic intervention in order to improve the following deficits and impairments:  Abnormal gait, Decreased range of motion, Difficulty walking, Increased muscle spasms, Decreased activity tolerance, Pain, Improper body mechanics, Impaired flexibility, Decreased balance, Decreased strength, Decreased mobility, Increased edema, Postural dysfunction  Visit Diagnosis: Pain in left ankle and joints of left foot  Difficulty in walking, not elsewhere classified  Muscle weakness (generalized)  Localized edema     Problem List Patient Active Problem List   Diagnosis Date Noted  . Displaced intraarticular fracture of left calcaneus, initial encounter for closed fracture 08/07/2017  . Viral syndrome 12/19/2014  . Otitis media 12/19/2014  . Testicular pain, right 07/12/2012  . Hyperglycemia 03/13/2012  . HTN (hypertension) 03/13/2012  . Hyperlipidemia 06/26/2009  . FATTY LIVER DISEASE 06/26/2009  . TRANSAMINASES, SERUM, ELEVATED 06/26/2009    Mohan Erven C. Megon Kalina PT, DPT 10/15/17 4:33 PM   [facility] [address] [place], [place], [address] Phone: [phone]   Fax:  [phone]  Name: Blair HaileyBa T Starlin MRN: [id number] Date of Birth: [birth year]

## 2017-10-20 ENCOUNTER — Encounter: Payer: Self-pay | Admitting: Physical Therapy

## 2017-10-20 ENCOUNTER — Encounter: Payer: Self-pay | Admitting: Family

## 2017-10-20 ENCOUNTER — Ambulatory Visit: Payer: BC Managed Care – PPO | Admitting: Physical Therapy

## 2017-10-20 DIAGNOSIS — M25572 Pain in left ankle and joints of left foot: Secondary | ICD-10-CM

## 2017-10-20 DIAGNOSIS — R600 Localized edema: Secondary | ICD-10-CM

## 2017-10-20 DIAGNOSIS — R262 Difficulty in walking, not elsewhere classified: Secondary | ICD-10-CM

## 2017-10-20 DIAGNOSIS — M6281 Muscle weakness (generalized): Secondary | ICD-10-CM

## 2017-10-20 MED ORDER — HYDROCHLOROTHIAZIDE 25 MG PO TABS: 25.0000 mg | ORAL_TABLET | Freq: Every day | ORAL | 0 refills | Status: DC

## 2017-10-20 NOTE — Therapy (Signed)
[facility] [address] [place], [place], [address] Phone: [phone]   Fax:  [phone]  Physical Therapy Treatment  Patient Details  Name: [name] HaileyBa T Henne MRN: [id number] Date of Birth: 03/23/1976 Referring Provider: Tarry KosXu, Naiping M, MD  Encounter Date: 10/20/2017      PT End of Session - 10/20/17 1546    Visit Number 8   Number of Visits 21   Date for PT Re-Evaluation 12/05/17   Authorization Type BCBS- no visit limit   PT Start Time 1546   PT Stop Time 1643   PT Time Calculation (min) 57 min   Activity Tolerance Patient tolerated treatment well   Behavior During Therapy [facility] for tasks assessed/performed      Past Medical History:  Diagnosis Date  . Abnormal LFTs (liver function tests)    secondary to lovastatin and fatty liver  . Calcaneal fracture 07/31/2017   left  . Fatty liver    history of fatty liver  . Hyperlipidemia   . Hypertension     History reviewed. No pertinent surgical history.  There were no vitals filed for this visit.      Subjective Assessment - 10/20/17 1546    Subjective Pt has been walking without crutches since Sunday. Feels a little stiff, still has some pain with weight bearing. "I can handle it"     Patient Stated Goals walk normally, exercise-run on treadmill   Currently in Pain? No/denies                         [facility] Adult PT Treatment/Exercise - 10/20/17 0001      Knee/Hip Exercises: Stretches   Gastroc Stretch 2 reps;30 seconds   Gastroc Stretch Limitations slant board     Knee/Hip Exercises: Aerobic   Nustep 5 min L6 Le only     Vasopneumatic   Number Minutes Vasopneumatic  15 minutes   Vasopnuematic Location  Ankle   Vasopneumatic Pressure Low   Vasopneumatic Temperature  34     Manual Therapy   Manual therapy comments rhythmic stabilizations   Joint Mobilization talocrural distraction, AP mobs at end range DF     Ankle Exercises: Supine   T-Band green 4-way   Other Supine Ankle Exercises PF/DF & circles with leg elevated     Ankle Exercises: Standing   Other Standing Ankle Exercises stepping on to airex-balance control & weight acceptance   Other Standing Ankle Exercises gait training in parallel bars                  PT Short Term Goals - 09/24/17 1705      PT SHORT TERM GOAL #1   Title DF ROM to 10 deg for necessary rage in gait pattern   Baseline see flowsheet   Time 4   Period Weeks   Status New   Target Date 10/24/17     PT SHORT TERM GOAL #2   Title Pt will be able to ambulate 100 ft comfortably with tennis shoes and without crutches, demonstrating proper gait pattern   Baseline unable at eval   Time 4   Period Weeks   Status New   Target Date 10/24/17           PT Long Term Goals - 09/24/17 1707      PT LONG TERM GOAL #1   Title Gross hip, knee and ankle strength to 5/5 for proper support to biomechanical chain  Baseline see flowsheet   Time 10   Period Weeks   Status New   Target Date 12/05/17     PT LONG TERM GOAL #2   Title Pt will be able to return to light jogging for long term exercise   Baseline unable at eval   Time 10   Period Weeks   Status New   Target Date 12/05/17     PT LONG TERM GOAL #3   Title FOTO to 31% limitation to indicate significant improvement in functional ability   Baseline 53% limited at eval   Time 10   Period Weeks   Status New   Target Date 12/05/17     PT LONG TERM GOAL #4   Title Pt will be able to ambulate all necessary community distances without limitaiton by ankle pain   Baseline unable at eval   Time 10   Period Weeks   Status New   Target Date 12/05/17               Plan - 10/20/17 1630    Clinical Impression Statement Pt is wearing compression stocking but cont to have significant edema in ankle, able to reduce with exercises in elevated position & manual therapy. Is able to ambulate without crutches but demo quick stap  extension of knee in stance phase due to weakness.    PT Treatment/Interventions ADLs/Self Care Home Management;Cryotherapy;Electrical Stimulation;Iontophoresis 4mg /ml Dexamethasone;Functional mobility training;Stair training;Gait training;Ultrasound;Traction;Moist Heat;Therapeutic activities;Therapeutic exercise;Balance training;Neuromuscular re-education;Patient/family education;Passive range of motion;Manual techniques;Dry needling;Taping;Vasopneumatic Device   PT Next Visit Plan ankle mobs PRN, gait training   PT Home Exercise Plan toe curls, PF/DF, ankle circles, soleus stretch, gastroc+hamstring stretch, standing lateral weight shift; hip ext with HS curl, hip abd, SLR with ER   Consulted and Agree with Plan of Care Patient      Patient will benefit from skilled therapeutic intervention in order to improve the following deficits and impairments:  Abnormal gait, Decreased range of motion, Difficulty walking, Increased muscle spasms, Decreased activity tolerance, Pain, Improper body mechanics, Impaired flexibility, Decreased balance, Decreased strength, Decreased mobility, Increased edema, Postural dysfunction  Visit Diagnosis: Pain in left ankle and joints of left foot  Difficulty in walking, not elsewhere classified  Muscle weakness (generalized)  Localized edema     Problem List Patient Active Problem List   Diagnosis Date Noted  . Displaced intraarticular fracture of left calcaneus, initial encounter for closed fracture 08/07/2017  . Viral syndrome 12/19/2014  . Otitis media 12/19/2014  . Testicular pain, right 07/12/2012  . Hyperglycemia 03/13/2012  . HTN (hypertension) 03/13/2012  . Hyperlipidemia 06/26/2009  . FATTY LIVER DISEASE 06/26/2009  . TRANSAMINASES, SERUM, ELEVATED 06/26/2009    Joseh Sjogren C. Medora Roorda PT, DPT 10/20/17 4:32 PM   [facility] Outpatient Rehabilitation [facility] [address] [place], [place], [address] Phone: [phone]   Fax:   [phone]  Name: XZAVIOR REINIG MRN: [id number] Date of Birth: 09-25-1976

## 2017-10-22 ENCOUNTER — Encounter: Payer: Self-pay | Admitting: Physical Therapy

## 2017-10-22 ENCOUNTER — Ambulatory Visit: Payer: BC Managed Care – PPO | Admitting: Physical Therapy

## 2017-10-22 DIAGNOSIS — R262 Difficulty in walking, not elsewhere classified: Secondary | ICD-10-CM

## 2017-10-22 DIAGNOSIS — M25572 Pain in left ankle and joints of left foot: Secondary | ICD-10-CM

## 2017-10-22 DIAGNOSIS — M6281 Muscle weakness (generalized): Secondary | ICD-10-CM

## 2017-10-22 DIAGNOSIS — R600 Localized edema: Secondary | ICD-10-CM

## 2017-10-22 NOTE — Therapy (Signed)
[facility] [address] [place], [place], [address] Phone: [phone]   Fax:  [phone]  Physical Therapy Treatment  Patient Details  Name: [name] [name] MRN: [id number] Date of Birth: [birth year] Referring Provider: Tarry KosXu, Naiping M, MD  Encounter Date: 10/22/2017      PT End of Session - 10/22/17 1554    Visit Number 9   Number of Visits 21   Date for PT Re-Evaluation 12/05/17   Authorization Type BCBS- no visit limit   PT Start Time 1551   PT Stop Time 1642   PT Time Calculation (min) 51 min   Activity Tolerance Patient tolerated treatment well   Behavior During Therapy [facility] for tasks assessed/performed      Past Medical History:  Diagnosis Date  . Abnormal LFTs (liver function tests)    secondary to lovastatin and fatty liver  . Calcaneal fracture 07/31/2017   left  . Fatty liver    history of fatty liver  . Hyperlipidemia   . Hypertension     History reviewed. No pertinent surgical history.  There were no vitals filed for this visit.                       [facility] Adult PT Treatment/Exercise - 10/22/17 0001      Knee/Hip Exercises: Stretches   Passive Hamstring Stretch Limitations seated EOB     Knee/Hip Exercises: Aerobic   Elliptical 5 min L1 ramp 10     Knee/Hip Exercises: Standing   Functional Squat Limitations slow sit to quick stand   SLS on airex   Other Standing Knee Exercises slow marching on airex     Knee/Hip Exercises: Supine   Single Leg Bridge Both;2 sets;10 reps     Knee/Hip Exercises: Sidelying   Hip ABduction Both;20 reps   Hip ABduction Limitations abduction to extension      Knee/Hip Exercises: Prone   Hip Extension 20 reps;Both   Hip Extension Limitations with iso HS curl     Vasopneumatic   Number Minutes Vasopneumatic  15 minutes   Vasopnuematic Location  Ankle   Vasopneumatic Pressure Low   Vasopneumatic Temperature  34                  PT  Short Term Goals - 09/24/17 1705      PT SHORT TERM GOAL #1   Title DF ROM to 10 deg for necessary rage in gait pattern   Baseline see flowsheet   Time 4   Period Weeks   Status New   Target Date 10/24/17     PT SHORT TERM GOAL #2   Title Pt will be able to ambulate 100 ft comfortably with tennis shoes and without crutches, demonstrating proper gait pattern   Baseline unable at eval   Time 4   Period Weeks   Status New   Target Date 10/24/17           PT Long Term Goals - 09/24/17 1707      PT LONG TERM GOAL #1   Title Gross hip, knee and ankle strength to 5/5 for proper support to biomechanical chain   Baseline see flowsheet   Time 10   Period Weeks   Status New   Target Date 12/05/17     PT LONG TERM GOAL #2   Title Pt will be able to return to light jogging for long term exercise   Baseline unable at eval  Time 10   Period Weeks   Status New   Target Date 12/05/17     PT LONG TERM GOAL #3   Title FOTO to 31% limitation to indicate significant improvement in functional ability   Baseline 53% limited at eval   Time 10   Period Weeks   Status New   Target Date 12/05/17     PT LONG TERM GOAL #4   Title Pt will be able to ambulate all necessary community distances without limitaiton by ankle pain   Baseline unable at eval   Time 10   Period Weeks   Status New   Target Date 12/05/17               Plan - 10/22/17 1556    Clinical Impression Statement Exercises to encourage hip extension strength & flexibility today due to pt cont hesitation to extend in gait. Demo good DF ROM and toe off. Notable improvement in controlling knee ext.    PT Treatment/Interventions ADLs/Self Care Home Management;Cryotherapy;Electrical Stimulation;Iontophoresis 4mg /ml Dexamethasone;Functional mobility training;Stair training;Gait training;Ultrasound;Traction;Moist Heat;Therapeutic activities;Therapeutic exercise;Balance training;Neuromuscular re-education;Patient/family  education;Passive range of motion;Manual techniques;Dry needling;Taping;Vasopneumatic Device   PT Next Visit Plan ankle mobs PRN, gait training   PT Home Exercise Plan toe curls, PF/DF, ankle circles, soleus stretch, gastroc+hamstring stretch, standing lateral weight shift; hip ext with HS curl, hip abd, SLR with ER   Consulted and Agree with Plan of Care Patient      Patient will benefit from skilled therapeutic intervention in order to improve the following deficits and impairments:  Abnormal gait, Decreased range of motion, Difficulty walking, Increased muscle spasms, Decreased activity tolerance, Pain, Improper body mechanics, Impaired flexibility, Decreased balance, Decreased strength, Decreased mobility, Increased edema, Postural dysfunction  Visit Diagnosis: Pain in left ankle and joints of left foot  Difficulty in walking, not elsewhere classified  Muscle weakness (generalized)  Localized edema     Problem List Patient Active Problem List   Diagnosis Date Noted  . Displaced intraarticular fracture of left calcaneus, initial encounter for closed fracture 08/07/2017  . Viral syndrome 12/19/2014  . Otitis media 12/19/2014  . Testicular pain, right 07/12/2012  . Hyperglycemia 03/13/2012  . HTN (hypertension) 03/13/2012  . Hyperlipidemia 06/26/2009  . FATTY LIVER DISEASE 06/26/2009  . TRANSAMINASES, SERUM, ELEVATED 06/26/2009    Gabby Rackers C. Kratos Ruscitti PT, DPT 10/22/17 4:29 PM   [facility] [address] [place], [place], [address] Phone: [phone]   Fax:  [phone]  Name: [name] [name] MRN: [id number] Date of Birth: [birth year]

## 2017-10-27 ENCOUNTER — Encounter: Payer: Self-pay | Admitting: Physical Therapy

## 2017-10-27 ENCOUNTER — Ambulatory Visit: Payer: BC Managed Care – PPO | Admitting: Physical Therapy

## 2017-10-27 DIAGNOSIS — M6281 Muscle weakness (generalized): Secondary | ICD-10-CM

## 2017-10-27 DIAGNOSIS — R262 Difficulty in walking, not elsewhere classified: Secondary | ICD-10-CM

## 2017-10-27 DIAGNOSIS — R600 Localized edema: Secondary | ICD-10-CM

## 2017-10-27 DIAGNOSIS — M25572 Pain in left ankle and joints of left foot: Secondary | ICD-10-CM | POA: Diagnosis not present

## 2017-10-27 NOTE — Therapy (Signed)
[facility] [address] [place], [place], [address] Phone: [phone]   Fax:  [phone]  Physical Therapy Treatment  Patient Details  Name: [name] [name] MRN: [id number] Date of Birth: 1976/06/19 Referring Provider: Tarry Kos, MD  Encounter Date: 10/27/2017      PT End of Session - 10/27/17 1551    Visit Number 10   Number of Visits 21   Date for PT Re-Evaluation 12/05/17   Authorization Type BCBS- no visit limit   PT Start Time 1549   PT Stop Time 1626   PT Time Calculation (min) 37 min   Activity Tolerance Patient tolerated treatment well   Behavior During Therapy [facility] for tasks assessed/performed      Past Medical History:  Diagnosis Date  . Abnormal LFTs (liver function tests)    secondary to lovastatin and fatty liver  . Calcaneal fracture 07/31/2017   left  . Fatty liver    history of fatty liver  . Hyperlipidemia   . Hypertension     History reviewed. No pertinent surgical history.  There were no vitals filed for this visit.      Subjective Assessment - 10/27/17 1551    Subjective Took a tylenol when he did a little too much.    Patient Stated Goals walk normally, exercise-run on treadmill   Currently in Pain? No/denies            [facility] PT Assessment - 10/27/17 0001      Observation/Other Assessments   Focus on Therapeutic Outcomes (FOTO)  36% limited     AROM   Left Ankle Dorsiflexion 10   Left Ankle Plantar Flexion 50     Strength   Left Ankle Dorsiflexion 4+/5   Left Ankle Plantar Flexion 3-/5  able to balance on L but unable to raise in single leg stanc   Left Ankle Inversion 4/5   Left Ankle Eversion 5/5                     [facility] Adult PT Treatment/Exercise - 10/27/17 0001      Knee/Hip Exercises: Stretches   Gastroc Stretch 2 reps;30 seconds   Gastroc Stretch Limitations slant board     Knee/Hip Exercises: Aerobic   Elliptical 5 min L1 ramp 10     Knee/Hip Exercises: Standing   Heel Raises Limitations bilateral heel raises, L heel lower     Knee/Hip Exercises: Seated   Other Seated Knee/Hip Exercises ankle inversion, DF, PF     Knee/Hip Exercises: Supine   Other Supine Knee/Hip Exercises pilates reformer: heel raises double & single, squat on platform flat feet,                 PT Education - 10/27/17 1631    Education provided Yes   Education Details goals, objective measures, FOTO   Person(s) Educated Patient   Methods Explanation;Demonstration;Tactile cues;Verbal cues;Handout   Comprehension Verbalized understanding;Returned demonstration;Verbal cues required;Tactile cues required;Need further instruction          PT Short Term Goals - 10/27/17 1631      PT SHORT TERM GOAL #1   Title DF ROM to 10 deg for necessary rage in gait pattern   Baseline see flowsheet   Status Achieved     PT SHORT TERM GOAL #2   Title Pt will be able to ambulate 100 ft comfortably with tennis shoes and without crutches, demonstrating proper gait pattern   Status Achieved  PT Long Term Goals - 09/24/17 1707      PT LONG TERM GOAL #1   Title Gross hip, knee and ankle strength to 5/5 for proper support to biomechanical chain   Baseline see flowsheet   Time 10   Period Weeks   Status New   Target Date 12/05/17     PT LONG TERM GOAL #2   Title Pt will be able to return to light jogging for long term exercise   Baseline unable at eval   Time 10   Period Weeks   Status New   Target Date 12/05/17     PT LONG TERM GOAL #3   Title FOTO to 31% limitation to indicate significant improvement in functional ability   Baseline 53% limited at eval   Time 10   Period Weeks   Status New   Target Date 12/05/17     PT LONG TERM GOAL #4   Title Pt will be able to ambulate all necessary community distances without limitaiton by ankle pain   Baseline unable at eval   Time 10   Period Weeks   Status New   Target Date  12/05/17               Plan - 10/27/17 1627    Clinical Impression Statement Pt has made good progress toward goals but cont to demo significant weakness in gastroc/soleus. Gait pattern is good when he thinks about it. Will continue to challenge gross strength and progress as appropraite   PT Treatment/Interventions ADLs/Self Care Home Management;Cryotherapy;Electrical Stimulation;Iontophoresis 4mg /ml Dexamethasone;Functional mobility training;Stair training;Gait training;Ultrasound;Traction;Moist Heat;Therapeutic activities;Therapeutic exercise;Balance training;Neuromuscular re-education;Patient/family education;Passive range of motion;Manual techniques;Dry needling;Taping;Vasopneumatic Device   PT Next Visit Plan gross lower leg strengthening   PT Home Exercise Plan toe curls, PF/DF, ankle circles, soleus stretch, gastroc+hamstring stretch, standing lateral weight shift; hip ext with HS curl, hip abd, SLR with ER   Consulted and Agree with Plan of Care Patient      Patient will benefit from skilled therapeutic intervention in order to improve the following deficits and impairments:  Abnormal gait, Decreased range of motion, Difficulty walking, Increased muscle spasms, Decreased activity tolerance, Pain, Improper body mechanics, Impaired flexibility, Decreased balance, Decreased strength, Decreased mobility, Increased edema, Postural dysfunction  Visit Diagnosis: Pain in left ankle and joints of left foot  Difficulty in walking, not elsewhere classified  Muscle weakness (generalized)  Localized edema     Problem List Patient Active Problem List   Diagnosis Date Noted  . Displaced intraarticular fracture of left calcaneus, initial encounter for closed fracture 08/07/2017  . Viral syndrome 12/19/2014  . Otitis media 12/19/2014  . Testicular pain, right 07/12/2012  . Hyperglycemia 03/13/2012  . HTN (hypertension) 03/13/2012  . Hyperlipidemia 06/26/2009  . FATTY LIVER DISEASE  06/26/2009  . TRANSAMINASES, SERUM, ELEVATED 06/26/2009    Jnyah Brazee C. Shamir Sedlar PT, DPT 10/27/17 4:32 PM   [facility] [address] [place], [place], [address] Phone: [phone]   Fax:  [phone]  Name: [name] [name] MRN: [id number] Date of Birth: [birth year]

## 2017-10-29 ENCOUNTER — Ambulatory Visit: Payer: BC Managed Care – PPO | Admitting: Physical Therapy

## 2017-10-29 ENCOUNTER — Encounter: Payer: Self-pay | Admitting: Physical Therapy

## 2017-10-29 DIAGNOSIS — M25572 Pain in left ankle and joints of left foot: Secondary | ICD-10-CM | POA: Diagnosis not present

## 2017-10-29 DIAGNOSIS — R600 Localized edema: Secondary | ICD-10-CM

## 2017-10-29 DIAGNOSIS — M6281 Muscle weakness (generalized): Secondary | ICD-10-CM

## 2017-10-29 DIAGNOSIS — R262 Difficulty in walking, not elsewhere classified: Secondary | ICD-10-CM

## 2017-10-29 NOTE — Therapy (Signed)
[facility] [address] [place], [place], [address] Phone: [phone]   Fax:  [phone]  Physical Therapy Treatment  Patient Details  Name: [name] [name] MRN: [id number] Date of Birth: 1976-11-24 Referring Provider: Tarry KosXu, Naiping M, MD  Encounter Date: 10/29/2017      PT End of Session - 10/29/17 1528    Visit Number 11   Number of Visits 21   Date for PT Re-Evaluation 12/05/17   Authorization Type BCBS- no visit limit   PT Start Time 1530   PT Stop Time 1612   PT Time Calculation (min) 42 min   Activity Tolerance Patient tolerated treatment well   Behavior During Therapy [facility] for tasks assessed/performed      Past Medical History:  Diagnosis Date  . Abnormal LFTs (liver function tests)    secondary to lovastatin and fatty liver  . Calcaneal fracture 07/31/2017   left  . Fatty liver    history of fatty liver  . Hyperlipidemia   . Hypertension     History reviewed. No pertinent surgical history.  There were no vitals filed for this visit.      Subjective Assessment - 10/29/17 1533    Subjective Second day back to work and it hurts. When I walk it really doesn't bother me. 4/10 when I push on the heel.    Patient Stated Goals walk normally, exercise-run on treadmill   Currently in Pain? Yes   Pain Score 4    Pain Location Heel   Pain Orientation Left   Pain Descriptors / Indicators Sore   Aggravating Factors  pressure through heel   Pain Relieving Factors elevating & moving                         [facility] Adult PT Treatment/Exercise - 10/29/17 0001      Knee/Hip Exercises: Aerobic   Nustep 5 min L6 LE only     Knee/Hip Exercises: Supine   Bridges Limitations bridge over physioball   Straight Leg Raises 15 reps   Straight Leg Raises Limitations 3#   Straight Leg Raise with External Rotation 15 reps   Straight Leg Raise with External Rotation Limitations 3#   Other Supine Knee/Hip  Exercises bridge-L leg over physioball with roll in     Knee/Hip Exercises: Sidelying   Hip ABduction 20 reps;10 reps   Hip ABduction Limitations in hip flexion     Ankle Exercises: Seated   Other Seated Ankle Exercises 4-way with leg elevated red tband     Ankle Exercises: Standing   Balance Master: Limits for Stability wobble board- A/P & lat, static & dynamic                PT Education - 10/29/17 1614    Education provided Yes   Education Details edema with return to work, edema & pain, return to running   Starwood HotelsPerson(s) Educated Patient   Methods Explanation;Demonstration;Tactile cues;Verbal cues   Comprehension Verbalized understanding;Returned demonstration;Verbal cues required;Tactile cues required;Need further instruction          PT Short Term Goals - 10/27/17 1631      PT SHORT TERM GOAL #1   Title DF ROM to 10 deg for necessary rage in gait pattern   Baseline see flowsheet   Status Achieved     PT SHORT TERM GOAL #2   Title Pt will be able to ambulate 100 ft comfortably with tennis shoes and without  crutches, demonstrating proper gait pattern   Status Achieved           PT Long Term Goals - 09/24/17 1707      PT LONG TERM GOAL #1   Title Gross hip, knee and ankle strength to 5/5 for proper support to biomechanical chain   Baseline see flowsheet   Time 10   Period Weeks   Status New   Target Date 12/05/17     PT LONG TERM GOAL #2   Title Pt will be able to return to light jogging for long term exercise   Baseline unable at eval   Time 10   Period Weeks   Status New   Target Date 12/05/17     PT LONG TERM GOAL #3   Title FOTO to 31% limitation to indicate significant improvement in functional ability   Baseline 53% limited at eval   Time 10   Period Weeks   Status New   Target Date 12/05/17     PT LONG TERM GOAL #4   Title Pt will be able to ambulate all necessary community distances without limitaiton by ankle pain   Baseline unable  at eval   Time 10   Period Weeks   Status New   Target Date 12/05/17               Plan - 10/29/17 1612    Clinical Impression Statement Time today taken for proximal hip strengthening in [place] due to pain in ankle from increased swelling since he has been unable to elevate the last 2 days back at work. Was able to stand without pain after a short amount of time in elevation. I encouraged him to elevate as much as possible. We discussed return to running which I told him he will slowly work back in to as he feels comfortable, is not limited by anatomical structures at this point.    PT Treatment/Interventions ADLs/Self Care Home Management;Cryotherapy;Electrical Stimulation;Iontophoresis 4mg /ml Dexamethasone;Functional mobility training;Stair training;Gait training;Ultrasound;Traction;Moist Heat;Therapeutic activities;Therapeutic exercise;Balance training;Neuromuscular re-education;Patient/family education;Passive range of motion;Manual techniques;Dry needling;Taping;Vasopneumatic Device   PT Next Visit Plan gross lower leg strengthening as tol   PT Home Exercise Plan toe curls, PF/DF, ankle circles, soleus stretch, gastroc+hamstring stretch, standing lateral weight shift; hip ext with HS curl, hip abd, SLR with ER   Consulted and Agree with Plan of Care Patient      Patient will benefit from skilled therapeutic intervention in order to improve the following deficits and impairments:  Abnormal gait, Decreased range of motion, Difficulty walking, Increased muscle spasms, Decreased activity tolerance, Pain, Improper body mechanics, Impaired flexibility, Decreased balance, Decreased strength, Decreased mobility, Increased edema, Postural dysfunction  Visit Diagnosis: Pain in left ankle and joints of left foot  Difficulty in walking, not elsewhere classified  Muscle weakness (generalized)  Localized edema     Problem List Patient Active Problem List   Diagnosis Date Noted  .  Displaced intraarticular fracture of left calcaneus, initial encounter for closed fracture 08/07/2017  . Viral syndrome 12/19/2014  . Otitis media 12/19/2014  . Testicular pain, right 07/12/2012  . Hyperglycemia 03/13/2012  . HTN (hypertension) 03/13/2012  . Hyperlipidemia 06/26/2009  . FATTY LIVER DISEASE 06/26/2009  . TRANSAMINASES, SERUM, ELEVATED 06/26/2009    Nahdia Doucet C. Gissella Niblack PT, DPT 10/29/17 4:16 PM   [facility] [address] [place], [place], [address] Phone: [phone]   Fax:  [phone]  Name: [name] [name] MRN: [id number] Date of Birth: Sep 28, 1976

## 2017-11-03 ENCOUNTER — Ambulatory Visit (INDEPENDENT_AMBULATORY_CARE_PROVIDER_SITE_OTHER): Payer: BC Managed Care – PPO

## 2017-11-03 ENCOUNTER — Encounter (INDEPENDENT_AMBULATORY_CARE_PROVIDER_SITE_OTHER): Payer: Self-pay | Admitting: Orthopaedic Surgery

## 2017-11-03 ENCOUNTER — Ambulatory Visit (INDEPENDENT_AMBULATORY_CARE_PROVIDER_SITE_OTHER): Payer: BC Managed Care – PPO | Admitting: Orthopaedic Surgery

## 2017-11-03 DIAGNOSIS — S92062A Displaced intraarticular fracture of left calcaneus, initial encounter for closed fracture: Secondary | ICD-10-CM

## 2017-11-03 NOTE — Progress Notes (Signed)
Nonoperative treatment of left calcaneus fracture.  He is progressing with physical therapy.  He continues to have swelling for which he wears compression hose for.  He is back to work.  He does have increased pain with prolonged standing and walking and activity.  Overall his swelling is improved but he still has persistent swelling. X-rays show a healed intra-articular calcaneus fracture. My standpoint he can Continue to work with physical therapy for gait training and strengthening.  He does understand that expected recovery from this injury is upwards of 18 months.  Questions encouraged and answered.  Follow-up as needed.

## 2017-11-06 ENCOUNTER — Encounter: Payer: Self-pay | Admitting: Physical Therapy

## 2017-11-06 ENCOUNTER — Ambulatory Visit: Payer: BC Managed Care – PPO | Attending: Orthopaedic Surgery | Admitting: Physical Therapy

## 2017-11-06 DIAGNOSIS — R262 Difficulty in walking, not elsewhere classified: Secondary | ICD-10-CM | POA: Diagnosis present

## 2017-11-06 DIAGNOSIS — R600 Localized edema: Secondary | ICD-10-CM | POA: Diagnosis present

## 2017-11-06 DIAGNOSIS — M6281 Muscle weakness (generalized): Secondary | ICD-10-CM

## 2017-11-06 DIAGNOSIS — M25572 Pain in left ankle and joints of left foot: Secondary | ICD-10-CM

## 2017-11-06 NOTE — Therapy (Signed)
[facility] [address] [place], [place], [address] Phone: [phone]   Fax:  [phone]  Physical Therapy Treatment  Patient Details  Name: [name] HaileyBa T Teston MRN: [id number] Date of Birth: [birth year] Referring Provider: Tarry KosXu, Naiping M, MD   Encounter Date: 11/06/2017  PT End of Session - 11/06/17 0850    Visit Number  12    Number of Visits  21    Date for PT Re-Evaluation  12/05/17    Authorization Type  BCBS- no visit limit    PT Start Time  0846    PT Stop Time  0940    PT Time Calculation (min)  54 min    Activity Tolerance  Patient tolerated treatment well    Behavior During Therapy  [facility] for tasks assessed/performed       Past Medical History:  Diagnosis Date  . Abnormal LFTs (liver function tests)    secondary to lovastatin and fatty liver  . Calcaneal fracture 07/31/2017   left  . Fatty liver    history of fatty liver  . Hyperlipidemia   . Hypertension     History reviewed. No pertinent surgical history.  There were no vitals filed for this visit.  Subjective Assessment - 11/06/17 0850    Subjective  I was walking fine from the car but then I sat down and moved it around, I heard a pop and it feels like something is not right. At home he has to rub and do exercise to get it going.     Patient Stated Goals  walk normally, exercise-run on treadmill    Currently in Pain?  Yes    Pain Score  7  only when walking   only when walking   Pain Location  Ankle    Pain Orientation  Left    Aggravating Factors   weight bearing    Pain Relieving Factors  rest                      [facility] Adult PT Treatment/Exercise - 11/06/17 0001      Knee/Hip Exercises: Aerobic   Elliptical  5 min L1 ramp 5      Vasopneumatic   Number Minutes Vasopneumatic   15 minutes    Vasopnuematic Location   Ankle    Vasopneumatic Pressure  Low    Vasopneumatic Temperature   34      Manual Therapy   Joint Mobilization   distraction    Passive ROM  ankle all motions with stretching      Ankle Exercises: Seated   Other Seated Ankle Exercises  PF/DF straight line      Ankle Exercises: Standing   Heel Raises  Other (comment) Heel/toe raises at counter   Heel/toe raises at counter   Other Standing Ankle Exercises  wobble board A/P and lat, static & dynamic             PT Education - 11/06/17 0904    Education provided  Yes    Education Details  edema affecting movement, OKC v weight bearing for movement, pops heard, exercise form/rationale    Person(s) Educated  Patient    Methods  Explanation;Demonstration;Tactile cues;Verbal cues    Comprehension  Verbalized understanding;Need further instruction;Returned demonstration;Verbal cues required;Tactile cues required       PT Short Term Goals - 10/27/17 1631      PT SHORT TERM GOAL #1   Title  DF ROM to 10 deg  for necessary rage in gait pattern    Baseline  see flowsheet    Status  Achieved      PT SHORT TERM GOAL #2   Title  Pt will be able to ambulate 100 ft comfortably with tennis shoes and without crutches, demonstrating proper gait pattern    Status  Achieved        PT Long Term Goals - 09/24/17 1707      PT LONG TERM GOAL #1   Title  Gross hip, knee and ankle strength to 5/5 for proper support to biomechanical chain    Baseline  see flowsheet    Time  10    Period  Weeks    Status  New    Target Date  12/05/17      PT LONG TERM GOAL #2   Title  Pt will be able to return to light jogging for long term exercise    Baseline  unable at eval    Time  10    Period  Weeks    Status  New    Target Date  12/05/17      PT LONG TERM GOAL #3   Title  FOTO to 31% limitation to indicate significant improvement in functional ability    Baseline  53% limited at eval    Time  10    Period  Weeks    Status  New    Target Date  12/05/17      PT LONG TERM GOAL #4   Title  Pt will be able to ambulate all necessary community distances  without limitaiton by ankle pain    Baseline  unable at eval    Time  10    Period  Weeks    Status  New    Target Date  12/05/17            Plan - 11/06/17 0907    Clinical Impression Statement  Pt arrived to treatment with significant limp, after manual treatment was able to walk with improved pattern. Has necessary ROM for gait pattern but cont to flex at waist in stance phase rather than DF  for toe off. Encouraged pt to walk as normally as possible even when his foot is sore, discussed how aches and pains as well as swelling are to be expected and he has to manage those symptoms to the best of his ability    PT Treatment/Interventions  ADLs/Self Care Home Management;Cryotherapy;Electrical Stimulation;Iontophoresis 4mg /ml Dexamethasone;Functional mobility training;Stair training;Gait training;Ultrasound;Traction;Moist Heat;Therapeutic activities;Therapeutic exercise;Balance training;Neuromuscular re-education;Patient/family education;Passive range of motion;Manual techniques;Dry needling;Taping;Vasopneumatic Device    PT Next Visit Plan  CKC strengthening, gait pattern, manual PRN    PT Home Exercise Plan  toe curls, PF/DF, ankle circles, soleus stretch, gastroc+hamstring stretch, standing lateral weight shift; hip ext with HS curl, hip abd, SLR with ER; heel/toe raises       Patient will benefit from skilled therapeutic intervention in order to improve the following deficits and impairments:  Abnormal gait, Decreased range of motion, Difficulty walking, Increased muscle spasms, Decreased activity tolerance, Pain, Improper body mechanics, Impaired flexibility, Decreased balance, Decreased strength, Decreased mobility, Increased edema, Postural dysfunction  Visit Diagnosis: Pain in left ankle and joints of left foot  Difficulty in walking, not elsewhere classified  Muscle weakness (generalized)  Localized edema     Problem List Patient Active Problem List   Diagnosis Date  Noted  . Displaced intraarticular fracture of left calcaneus, initial encounter for closed fracture 08/07/2017  .  Viral syndrome 12/19/2014  . Otitis media 12/19/2014  . Testicular pain, right 07/12/2012  . Hyperglycemia 03/13/2012  . HTN (hypertension) 03/13/2012  . Hyperlipidemia 06/26/2009  . FATTY LIVER DISEASE 06/26/2009  . TRANSAMINASES, SERUM, ELEVATED 06/26/2009    Sheena Donegan C. Phung Kotas PT, DPT 11/06/17 9:29 AM   [facility] [address] [place], [place], [address] Phone: [phone]   Fax:  [phone]  Name: AARIC DOLPH MRN: [id number] Date of Birth: 04-Nov-1976

## 2017-11-11 ENCOUNTER — Encounter: Payer: Self-pay | Admitting: Physical Therapy

## 2017-11-11 ENCOUNTER — Ambulatory Visit: Payer: BC Managed Care – PPO | Admitting: Physical Therapy

## 2017-11-11 DIAGNOSIS — M25572 Pain in left ankle and joints of left foot: Secondary | ICD-10-CM

## 2017-11-11 DIAGNOSIS — R262 Difficulty in walking, not elsewhere classified: Secondary | ICD-10-CM

## 2017-11-11 DIAGNOSIS — R600 Localized edema: Secondary | ICD-10-CM

## 2017-11-11 DIAGNOSIS — M6281 Muscle weakness (generalized): Secondary | ICD-10-CM

## 2017-11-11 NOTE — Therapy (Signed)
[facility] [address] [place], [place], [address] Phone: [phone]   Fax:  [phone]  Physical Therapy Treatment  Patient Details  Name: [name] [name] MRN: [id number] Date of Birth: [birth year] Referring Provider: Tarry KosXu, Naiping M, MD   Encounter Date: 11/11/2017  PT End of Session - 11/11/17 0801    Visit Number  13    Number of Visits  21    Date for PT Re-Evaluation  12/05/17    Authorization Type  BCBS- no visit limit    PT Start Time  0801    PT Stop Time  0844    PT Time Calculation (min)  43 min    Activity Tolerance  Patient tolerated treatment well    Behavior During Therapy  [facility] for tasks assessed/performed       Past Medical History:  Diagnosis Date  . Abnormal LFTs (liver function tests)    secondary to lovastatin and fatty liver  . Calcaneal fracture 07/31/2017   left  . Fatty liver    history of fatty liver  . Hyperlipidemia   . Hypertension     History reviewed. No pertinent surgical history.  There were no vitals filed for this visit.  Subjective Assessment - 11/11/17 0801    Subjective  I took two tylenol so it's okay. I don't think it really bothers me.     Patient Stated Goals  walk normally, exercise-run on treadmill    Pain Location  Ankle    Pain Orientation  Left    Pain Descriptors / Indicators  Sore                      [facility] Adult PT Treatment/Exercise - 11/11/17 0001      Therapeutic Activites    Therapeutic Activities  ADL's    ADL's  stair navigation      Knee/Hip Exercises: Aerobic   Elliptical  5 min L1 ramp 10      Knee/Hip Exercises: Supine   Bridges Limitations  L single leg x20      Knee/Hip Exercises: Prone   Hip Extension  20 reps    Hip Extension Limitations  with iso HS curl      Ankle Exercises: Standing   SLS  L SLS to sit    Heel Raises  Other (comment) bilateral raise, single eccentric lower; bilat raise neut&ER    Other Standing Ankle  Exercises  wobble board single leg      Ankle Exercises: Stretches   Gastroc Stretch  2 reps;30 seconds x2 in session               PT Short Term Goals - 10/27/17 1631      PT SHORT TERM GOAL #1   Title  DF ROM to 10 deg for necessary rage in gait pattern    Baseline  see flowsheet    Status  Achieved      PT SHORT TERM GOAL #2   Title  Pt will be able to ambulate 100 ft comfortably with tennis shoes and without crutches, demonstrating proper gait pattern    Status  Achieved        PT Long Term Goals - 09/24/17 1707      PT LONG TERM GOAL #1   Title  Gross hip, knee and ankle strength to 5/5 for proper support to biomechanical chain    Baseline  see flowsheet    Time  10  Period  Weeks    Status  New    Target Date  12/05/17      PT LONG TERM GOAL #2   Title  Pt will be able to return to light jogging for long term exercise    Baseline  unable at eval    Time  10    Period  Weeks    Status  New    Target Date  12/05/17      PT LONG TERM GOAL #3   Title  FOTO to 31% limitation to indicate significant improvement in functional ability    Baseline  53% limited at eval    Time  10    Period  Weeks    Status  New    Target Date  12/05/17      PT LONG TERM GOAL #4   Title  Pt will be able to ambulate all necessary community distances without limitaiton by ankle pain    Baseline  unable at eval    Time  10    Period  Weeks    Status  New    Target Date  12/05/17            Plan - 11/11/17 [id number]    Clinical Impression Statement  Attempted step down from 4" step for eccentric strengthening but pt reported "it feels like it won't bend", instead pt stood on one foot in front of table and sit while trying to maintain SLS. Encouraged pt to think less about everything he feels because he is limiting himself in movements. Advised that it will be sore and unstable and that it is normal as we challenge strength. Practiced with stair case, smaller steps for  proper pattern and asked him to practice at home.     PT Treatment/Interventions  ADLs/Self Care Home Management;Cryotherapy;Electrical Stimulation;Iontophoresis 4mg /ml Dexamethasone;Functional mobility training;Stair training;Gait training;Ultrasound;Traction;Moist Heat;Therapeutic activities;Therapeutic exercise;Balance training;Neuromuscular re-education;Patient/family education;Passive range of motion;Manual techniques;Dry needling;Taping;Vasopneumatic Device    PT Next Visit Plan  recheck stair pattern, CKC strengthening    PT Home Exercise Plan  toe curls, PF/DF, ankle circles, soleus stretch, gastroc+hamstring stretch, standing lateral weight shift; hip ext with HS curl, hip abd, SLR with ER; heel/toe raises; stairs       Patient will benefit from skilled therapeutic intervention in order to improve the following deficits and impairments:  Abnormal gait, Decreased range of motion, Difficulty walking, Increased muscle spasms, Decreased activity tolerance, Pain, Improper body mechanics, Impaired flexibility, Decreased balance, Decreased strength, Decreased mobility, Increased edema, Postural dysfunction  Visit Diagnosis: Pain in left ankle and joints of left foot  Difficulty in walking, not elsewhere classified  Muscle weakness (generalized)  Localized edema     Problem List Patient Active Problem List   Diagnosis Date Noted  . Displaced intraarticular fracture of left calcaneus, initial encounter for closed fracture 08/07/2017  . Viral syndrome 12/19/2014  . Otitis media 12/19/2014  . Testicular pain, right 07/12/2012  . Hyperglycemia 03/13/2012  . HTN (hypertension) 03/13/2012  . Hyperlipidemia 06/26/2009  . FATTY LIVER DISEASE 06/26/2009  . TRANSAMINASES, SERUM, ELEVATED 06/26/2009   Evalie Hargraves C. Lizzet Hendley PT, DPT 11/11/17 8:46 AM   [facility] [address] [place], [place], [address] Phone: [phone]   Fax:   [phone]  Name: [name] [name] MRN: [id number] Date of Birth: 06-30-1976

## 2017-11-14 ENCOUNTER — Ambulatory Visit: Payer: BC Managed Care – PPO | Admitting: Physical Therapy

## 2017-11-14 ENCOUNTER — Encounter: Payer: Self-pay | Admitting: Physical Therapy

## 2017-11-14 DIAGNOSIS — R262 Difficulty in walking, not elsewhere classified: Secondary | ICD-10-CM

## 2017-11-14 DIAGNOSIS — M25572 Pain in left ankle and joints of left foot: Secondary | ICD-10-CM

## 2017-11-14 DIAGNOSIS — M6281 Muscle weakness (generalized): Secondary | ICD-10-CM

## 2017-11-14 DIAGNOSIS — R600 Localized edema: Secondary | ICD-10-CM

## 2017-11-14 NOTE — Therapy (Signed)
[facility] [address] [place], [place], [address] Phone: [phone]   Fax:  [phone]  Physical Therapy Treatment  Patient Details  Name: [name] [name] MRN: [id number] Date of Birth: 12-Feb-1976 Referring Provider: Tarry KosXu, Naiping M, MD   Encounter Date: 11/14/2017  PT End of Session - 11/14/17 0935    Visit Number  14    Number of Visits  21    Date for PT Re-Evaluation  12/05/17    Authorization Type  BCBS- no visit limit    PT Start Time  0931    PT Stop Time  1011    PT Time Calculation (min)  40 min    Activity Tolerance  Patient tolerated treatment well    Behavior During Therapy  [facility] for tasks assessed/performed       Past Medical History:  Diagnosis Date  . Abnormal LFTs (liver function tests)    secondary to lovastatin and fatty liver  . Calcaneal fracture 07/31/2017   left  . Fatty liver    history of fatty liver  . Hyperlipidemia   . Hypertension     History reviewed. No pertinent surgical history.  There were no vitals filed for this visit.  Subjective Assessment - 11/14/17 0935    Subjective  Feeling pretty good. When mentioned he was walking well, pt stated "I try"     Patient Stated Goals  walk normally, exercise-run on treadmill    Currently in Pain?  No/denies                      [facility] Treatment/Exercise - 11/14/17 0001      Knee/Hip Exercises: Stretches   Passive Hamstring Stretch Limitations  seated EOB      Knee/Hip Exercises: Aerobic   Stepper  5 min L5      Knee/Hip Exercises: Supine   Bridges with Clamshell  Other (comment) green tband, heel raises in bridge      Knee/Hip Exercises: Sidelying   Clams  x30 each green tband      Ankle Exercises: Stretches   Gastroc Stretch  2 reps;30 seconds slant board      Ankle Exercises: Standing   SLS  on airex in mini squat; SLS sit    Heel Raises  15 reps edge of step, x15 bilat raise/L lower    Other Standing  Ankle Exercises  6" step down      Ankle Exercises: Machines for Strengthening   Cybex Leg Press  used for PF strengthening               PT Short Term Goals - 10/27/17 1631      PT SHORT TERM GOAL #1   Title  DF ROM to 10 deg for necessary rage in gait pattern    Baseline  see flowsheet    Status  Achieved      PT SHORT TERM GOAL #2   Title  Pt will be able to ambulate 100 ft comfortably with tennis shoes and without crutches, demonstrating proper gait pattern    Status  Achieved        PT Long Term Goals - 09/24/17 1707      PT LONG TERM GOAL #1   Title  Gross hip, knee and ankle strength to 5/5 for proper support to biomechanical chain    Baseline  see flowsheet    Time  10    Period  Weeks  Status  New    Target Date  12/05/17      PT LONG TERM GOAL #2   Title  Pt will be able to return to light jogging for long term exercise    Baseline  unable at eval    Time  10    Period  Weeks    Status  New    Target Date  12/05/17      PT LONG TERM GOAL #3   Title  FOTO to 31% limitation to indicate significant improvement in functional ability    Baseline  53% limited at eval    Time  10    Period  Weeks    Status  New    Target Date  12/05/17      PT LONG TERM GOAL #4   Title  Pt will be able to ambulate all necessary community distances without limitaiton by ankle pain    Baseline  unable at eval    Time  10    Period  Weeks    Status  New    Target Date  12/05/17            Plan - 11/14/17 1011    Clinical Impression Statement  Pt was able to demo step down with control from 6" step today but required heavy VC. Increased stregnthening exercises and pt was able to perform without limping afterward. Asked him to use his stairs at home as an opportunity to exercises, focusing on form and keeping foot straight.     PT Treatment/Interventions  ADLs/Self Care Home Management;Cryotherapy;Electrical Stimulation;Iontophoresis 4mg /ml  Dexamethasone;Functional mobility training;Stair training;Gait training;Ultrasound;Traction;Moist Heat;Therapeutic activities;Therapeutic exercise;Balance training;Neuromuscular re-education;Patient/family education;Passive range of motion;Manual techniques;Dry needling;Taping;Vasopneumatic Device    PT Next Visit Plan  recheck stair pattern, CKC strengthening    PT Home Exercise Plan  toe curls, PF/DF, ankle circles, soleus stretch, gastroc+hamstring stretch, standing lateral weight shift; hip ext with HS curl, hip abd, SLR with ER; heel/toe raises; stairs       Patient will benefit from skilled therapeutic intervention in order to improve the following deficits and impairments:  Abnormal gait, Decreased range of motion, Difficulty walking, Increased muscle spasms, Decreased activity tolerance, Pain, Improper body mechanics, Impaired flexibility, Decreased balance, Decreased strength, Decreased mobility, Increased edema, Postural dysfunction  Visit Diagnosis: Pain in left ankle and joints of left foot  Difficulty in walking, not elsewhere classified  Muscle weakness (generalized)  Localized edema     Problem List Patient Active Problem List   Diagnosis Date Noted  . Displaced intraarticular fracture of left calcaneus, initial encounter for closed fracture 08/07/2017  . Viral syndrome 12/19/2014  . Otitis media 12/19/2014  . Testicular pain, right 07/12/2012  . Hyperglycemia 03/13/2012  . HTN (hypertension) 03/13/2012  . Hyperlipidemia 06/26/2009  . FATTY LIVER DISEASE 06/26/2009  . TRANSAMINASES, SERUM, ELEVATED 06/26/2009    [name] [name] C. Diogenes Whirley PT, DPT 11/14/17 10:13 AM   [facility] [address] [place], [place], [address] Phone: [phone]   Fax:  [phone]  Name: [name] [name] MRN: [id number] Date of Birth: 1976-03-11

## 2017-11-17 ENCOUNTER — Ambulatory Visit: Payer: BC Managed Care – PPO | Admitting: Physical Therapy

## 2017-11-17 ENCOUNTER — Encounter: Payer: BC Managed Care – PPO | Admitting: Physical Therapy

## 2017-11-17 ENCOUNTER — Encounter: Payer: Self-pay | Admitting: Physical Therapy

## 2017-11-17 DIAGNOSIS — R600 Localized edema: Secondary | ICD-10-CM

## 2017-11-17 DIAGNOSIS — R262 Difficulty in walking, not elsewhere classified: Secondary | ICD-10-CM

## 2017-11-17 DIAGNOSIS — M25572 Pain in left ankle and joints of left foot: Secondary | ICD-10-CM | POA: Diagnosis not present

## 2017-11-17 DIAGNOSIS — M6281 Muscle weakness (generalized): Secondary | ICD-10-CM

## 2017-11-17 NOTE — Therapy (Signed)
[facility] [address] [place], [place], [address] Phone: [phone]   Fax:  [phone]  Physical Therapy Treatment  Patient Details  Name: [name] [name] Wedeking MRN: [id number] Date of Birth: [birth year] Referring Provider: Tarry KosXu, Naiping M, MD   Encounter Date: 11/17/2017  PT End of Session - 11/17/17 1826    Visit Number  15    Number of Visits  21    Date for PT Re-Evaluation  12/05/17    PT Start Time  0731    PT Stop Time  0800    PT Time Calculation (min)  29 min    Activity Tolerance  Patient tolerated treatment well    Behavior During Therapy  [facility] for tasks assessed/performed       Past Medical History:  Diagnosis Date  . Abnormal LFTs (liver function tests)    secondary to lovastatin and fatty liver  . Calcaneal fracture 07/31/2017   left  . Fatty liver    history of fatty liver  . Hyperlipidemia   . Hypertension     History reviewed. No pertinent surgical history.  There were no vitals filed for this visit.  Subjective Assessment - 11/17/17 0811    Subjective  sore only.                       [facility] Adult PT Treatment/Exercise - 11/17/17 0001      Knee/Hip Exercises: Standing   Other Standing Knee Exercises  steps  up: step over step,  Down step over step with use of hands mod weight,  limited ROM into DF limits,       Manual Therapy   Joint Mobilization  distraction,  mob with movement anlke joint,  fibula a/p glides entire.      Passive ROM  ankle,  great toe.               PT Short Term Goals - 10/27/17 1631      PT SHORT TERM GOAL #1   Title  DF ROM to 10 deg for necessary rage in gait pattern    Baseline  see flowsheet    Status  Achieved      PT SHORT TERM GOAL #2   Title  Pt will be able to ambulate 100 ft comfortably with tennis shoes and without crutches, demonstrating proper gait pattern    Status  Achieved        PT Long Term Goals - 09/24/17 1707      PT LONG  TERM GOAL #1   Title  Gross hip, knee and ankle strength to 5/5 for proper support to biomechanical chain    Baseline  see flowsheet    Time  10    Period  Weeks    Status  New    Target Date  12/05/17      PT LONG TERM GOAL #2   Title  Pt will be able to return to light jogging for long term exercise    Baseline  unable at eval    Time  10    Period  Weeks    Status  New    Target Date  12/05/17      PT LONG TERM GOAL #3   Title  FOTO to 31% limitation to indicate significant improvement in functional ability    Baseline  53% limited at eval    Time  10    Period  Weeks  Status  New    Target Date  12/05/17      PT LONG TERM GOAL #4   Title  Pt will be able to ambulate all necessary community distances without limitaiton by ankle pain    Baseline  unable at eval    Time  10    Period  Weeks    Status  New    Target Date  12/05/17            Plan - 11/17/17 1826    Clinical Impression Statement  Patient had increased soreness post session , however he declined the need for modalities.  DF limited ease of descending steps.  Mobilization  with movement helpful with DF, (still limited)    PT Treatment/Interventions  ADLs/Self Care Home Management;Cryotherapy;Electrical Stimulation;Iontophoresis 4mg /ml Dexamethasone;Functional mobility training;Stair training;Gait training;Ultrasound;Traction;Moist Heat;Therapeutic activities;Therapeutic exercise;Balance training;Neuromuscular re-education;Patient/family education;Passive range of motion;Manual techniques;Dry needling;Taping;Vasopneumatic Device    PT Next Visit Plan  recheck stair pattern, CKC strengthening  work on more DF    PT Home Exercise Plan  toe curls, PF/DF, ankle circles, soleus stretch, gastroc+hamstring stretch, standing lateral weight shift; hip ext with HS curl, hip abd, SLR with ER; heel/toe raises; stairs    Consulted and Agree with Plan of Care  Patient       Patient will benefit from skilled  therapeutic intervention in order to improve the following deficits and impairments:     Visit Diagnosis: Pain in left ankle and joints of left foot  Difficulty in walking, not elsewhere classified  Muscle weakness (generalized)  Localized edema     Problem List Patient Active Problem List   Diagnosis Date Noted  . Displaced intraarticular fracture of left calcaneus, initial encounter for closed fracture 08/07/2017  . Viral syndrome 12/19/2014  . Otitis media 12/19/2014  . Testicular pain, right 07/12/2012  . Hyperglycemia 03/13/2012  . HTN (hypertension) 03/13/2012  . Hyperlipidemia 06/26/2009  . FATTY LIVER DISEASE 06/26/2009  . TRANSAMINASES, SERUM, ELEVATED 06/26/2009    Gavriel Holzhauer PTA 11/17/2017, 6:30 PM  [facility] [address] [place], [place], [address] Phone: [phone]   Fax:  [phone]  Name: [name] [name] Bazar MRN: [id number] Date of Birth: Apr 17, 1976

## 2017-11-19 ENCOUNTER — Ambulatory Visit: Payer: BC Managed Care – PPO | Admitting: Physical Therapy

## 2017-11-19 ENCOUNTER — Encounter: Payer: Self-pay | Admitting: Physical Therapy

## 2017-11-19 DIAGNOSIS — M25572 Pain in left ankle and joints of left foot: Secondary | ICD-10-CM | POA: Diagnosis not present

## 2017-11-19 DIAGNOSIS — M6281 Muscle weakness (generalized): Secondary | ICD-10-CM

## 2017-11-19 DIAGNOSIS — R262 Difficulty in walking, not elsewhere classified: Secondary | ICD-10-CM

## 2017-11-19 DIAGNOSIS — R600 Localized edema: Secondary | ICD-10-CM

## 2017-11-19 NOTE — Therapy (Signed)
[facility] [address] [place], [place], [address] Phone: [phone]   Fax:  [phone]  Physical Therapy Treatment  Patient Details  Name: [name] [name] MRN: [id number] Date of Birth: November 25, 1976 Referring Provider: Gershon MusselXu, Naiping, MD   Encounter Date: 11/19/2017  PT End of Session - 11/19/17 0833    Visit Number  16    Number of Visits  21    Date for PT Re-Evaluation  12/05/17    Authorization Type  BCBS- no visit limit    PT Start Time  0833    PT Stop Time  0916    PT Time Calculation (min)  43 min    Activity Tolerance  Patient tolerated treatment well    Behavior During Therapy  [facility] for tasks assessed/performed       Past Medical History:  Diagnosis Date  . Abnormal LFTs (liver function tests)    secondary to lovastatin and fatty liver  . Calcaneal fracture 07/31/2017   left  . Fatty liver    history of fatty liver  . Hyperlipidemia   . Hypertension     History reviewed. No pertinent surgical history.  There were no vitals filed for this visit.  Subjective Assessment - 11/19/17 0833    Subjective  denies any pain today, overall doing well. Did not practice stairs as much at home.     Patient Stated Goals  walk normally, exercise-run on treadmill    Currently in Pain?  No/denies         [facility] - 11/19/17 0001      Assessment   Medical Diagnosis  Left calcaneus fx    Referring Provider  Gershon MusselXu, Naiping, MD    Onset Date/Surgical Date  07/31/17    Hand Dominance  Right      Observation/Other Assessments   Focus on Therapeutic Outcomes (FOTO)   53% limited pt reports feels better and does not think this represents      AROM   Left Ankle Dorsiflexion  6    Left Ankle Plantar Flexion  60      PROM   Left Ankle Dorsiflexion  10      Strength   Left Ankle Dorsiflexion  5/5    Left Ankle Inversion  4+/5    Left Ankle Eversion  5/5                  [facility] Adult PT  Treatment/Exercise - 11/19/17 0001      Knee/Hip Exercises: Stretches   Gastroc Stretch  2 reps;30 seconds    Gastroc Stretch Limitations  slant board      Knee/Hip Exercises: Aerobic   Stepper  5 min L5      Knee/Hip Exercises: Machines for Strengthening   Cybex Leg Press  10lb for Lt plantar flexion      Ankle Exercises: Standing   Other Standing Ankle Exercises  trampoline- bilat & alt bouncing, SLS, alt bouncing HS curls, jumping jax, skiiers      Ankle Exercises: Plyometrics   Bilateral Jumping  Other (comment) attempted with VC             PT Education - 11/19/17 0859    Education provided  Yes    Education Details  edema v injury, strenghtening, swelling to be present for a while, strengthening & importance, return to running and strength required, goals FOTO    Person(s) Educated  Patient    Methods  Explanation;Demonstration;Tactile cues;Verbal cues  Comprehension  Verbalized understanding;Need further instruction;Returned demonstration;Verbal cues required;Tactile cues required       PT Short Term Goals - 10/27/17 1631      PT SHORT TERM GOAL #1   Title  DF ROM to 10 deg for necessary rage in gait pattern    Baseline  see flowsheet    Status  Achieved      PT SHORT TERM GOAL #2   Title  Pt will be able to ambulate 100 ft comfortably with tennis shoes and without crutches, demonstrating proper gait pattern    Status  Achieved        PT Long Term Goals - 09/24/17 1707      PT LONG TERM GOAL #1   Title  Gross hip, knee and ankle strength to 5/5 for proper support to biomechanical chain    Baseline  see flowsheet    Time  10    Period  Weeks    Status  New    Target Date  12/05/17      PT LONG TERM GOAL #2   Title  Pt will be able to return to light jogging for long term exercise    Baseline  unable at eval    Time  10    Period  Weeks    Status  New    Target Date  12/05/17      PT LONG TERM GOAL #3   Title  FOTO to 31% limitation to  indicate significant improvement in functional ability    Baseline  53% limited at eval    Time  10    Period  Weeks    Status  New    Target Date  12/05/17      PT LONG TERM GOAL #4   Title  Pt will be able to ambulate all necessary community distances without limitaiton by ankle pain    Baseline  unable at eval    Time  10    Period  Weeks    Status  New    Target Date  12/05/17            Plan - 11/19/17 1610    Clinical Impression Statement  Pt was interested in progressing to plyometrics and running. Discussed importance of baseline strength in order to be able to do so. Utilized trampoline today as intro to plyometric which pt tolerated well but compensated in bilateral motions by using Rt leg rather than placing equal WB. Pt had significant decrease in FOTO score today even though he is making progress and reports feeling much better. Will continue strengthening for 2 more weeks and then d/c to independent program for strengthening. Discussed returning to PT at a later date for running technique after he has improved his strength for a little while.     PT Treatment/Interventions  ADLs/Self Care Home Management;Cryotherapy;Electrical Stimulation;Iontophoresis 4mg /ml Dexamethasone;Functional mobility training;Stair training;Gait training;Ultrasound;Traction;Moist Heat;Therapeutic activities;Therapeutic exercise;Balance training;Neuromuscular re-education;Patient/family education;Passive range of motion;Manual techniques;Dry needling;Taping;Vasopneumatic Device    PT Next Visit Plan  stairs, trampoline, single leg gastroc strengthening    PT Home Exercise Plan  toe curls, PF/DF, ankle circles, soleus stretch, gastroc+hamstring stretch, standing lateral weight shift; hip ext with HS curl, hip abd, SLR with ER; heel/toe raises; stairs    Consulted and Agree with Plan of Care  Patient       Patient will benefit from skilled therapeutic intervention in order to improve the following  deficits and impairments:  Abnormal gait, Decreased range of  motion, Difficulty walking, Increased muscle spasms, Decreased activity tolerance, Pain, Improper body mechanics, Impaired flexibility, Decreased balance, Decreased strength, Decreased mobility, Increased edema, Postural dysfunction  Visit Diagnosis: Pain in left ankle and joints of left foot  Difficulty in walking, not elsewhere classified  Muscle weakness (generalized)  Localized edema     Problem List Patient Active Problem List   Diagnosis Date Noted  . Displaced intraarticular fracture of left calcaneus, initial encounter for closed fracture 08/07/2017  . Viral syndrome 12/19/2014  . Otitis media 12/19/2014  . Testicular pain, right 07/12/2012  . Hyperglycemia 03/13/2012  . HTN (hypertension) 03/13/2012  . Hyperlipidemia 06/26/2009  . FATTY LIVER DISEASE 06/26/2009  . TRANSAMINASES, SERUM, ELEVATED 06/26/2009   Ashleen Demma C. Derya Dettmann PT, DPT 11/19/17 9:21 AM   [facility] [address] [place], [place], [address] Phone: [phone]   Fax:  [phone]  Name: [name] [name] MRN: [id number] Date of Birth: 1976-06-02

## 2017-11-24 ENCOUNTER — Encounter: Payer: Self-pay | Admitting: Physical Therapy

## 2017-11-24 ENCOUNTER — Ambulatory Visit: Payer: BC Managed Care – PPO | Admitting: Physical Therapy

## 2017-11-24 DIAGNOSIS — M6281 Muscle weakness (generalized): Secondary | ICD-10-CM

## 2017-11-24 DIAGNOSIS — R600 Localized edema: Secondary | ICD-10-CM

## 2017-11-24 DIAGNOSIS — R262 Difficulty in walking, not elsewhere classified: Secondary | ICD-10-CM

## 2017-11-24 DIAGNOSIS — M25572 Pain in left ankle and joints of left foot: Secondary | ICD-10-CM

## 2017-11-24 NOTE — Therapy (Signed)
[place], [place], [address] Phone: [phone]   Fax:  [phone]  Physical Therapy Treatment/Discharge summary  Patient Details  Name: [name] [name] MRN: [id number] Date of Birth: 12/16/1976 Referring Provider: Frankey Shown, MD   Encounter Date: 11/24/2017  PT End of Session - 11/24/17 0848    Visit Number  17    Number of Visits  21    Date for PT Re-Evaluation  12/05/17    Authorization Type  BCBS- no visit limit    PT Start Time  0845    PT Stop Time  0924    PT Time Calculation (min)  39 min    Activity Tolerance  Patient tolerated treatment well    Behavior During Therapy  [facility] for tasks assessed/performed       Past Medical History:  Diagnosis Date  . Abnormal LFTs (liver function tests)    secondary to lovastatin and fatty liver  . Calcaneal fracture 07/31/2017   left  . Fatty liver    history of fatty liver  . Hyperlipidemia   . Hypertension     History reviewed. No pertinent surgical history.  There were no vitals filed for this visit.  Subjective Assessment - 11/24/17 0849    Subjective  I think it is sore today         [facility] PT Assessment - 11/24/17 0001      Observation/Other Assessments   Focus on Therapeutic Outcomes (FOTO)   43% limited      Strength   Left Ankle Dorsiflexion  5/5    Left Ankle Inversion  5/5    Left Ankle Eversion  5/5                  [facility] Adult PT Treatment/Exercise - 11/24/17 0001      Knee/Hip Exercises: Aerobic   Elliptical  5 min L1 ramp 10, retro      Knee/Hip Exercises: Standing   Other Standing Knee Exercises  toe walking      Ankle Exercises: Stretches   Gastroc Stretch  Other (comment) edge of step      Ankle Exercises: Standing   SLS  5 cone reach to tap    Heel Raises  Other (comment) edge of step, bilat raise- single lower    Other Standing Ankle Exercises  6" step             PT Education - 11/24/17  0931    Education provided  Yes    Education Details  importance of continued strengthening & frequency, building up to plyometrics, goals & FOTO discussion, most important exercises    Person(s) Educated  Patient    Methods  Explanation;Demonstration;Tactile cues;Verbal cues;Handout    Comprehension  Verbalized understanding;Returned demonstration;Verbal cues required;Tactile cues required       PT Short Term Goals - 10/27/17 1631      PT SHORT TERM GOAL #1   Title  DF ROM to 10 deg for necessary rage in gait pattern    Baseline  see flowsheet    Status  Achieved      PT SHORT TERM GOAL #2   Title  Pt will be able to ambulate 100 ft comfortably with tennis shoes and without crutches, demonstrating proper gait pattern    Status  Achieved        PT Long Term Goals - 11/24/17 0912      PT LONG TERM GOAL #1   Title  Gross hip, knee and ankle strength to 5/5 for proper support to biomechanical chain    Baseline  gross 5/5 with exception of L ankle PF 3/5    Status  Partially Met      PT LONG TERM GOAL #2   Title  Pt will be able to return to light jogging for long term exercise    Baseline  cont to lack strength for jogging    Status  Not Met      PT LONG TERM GOAL #3   Title  FOTO to 31% limitation to indicate significant improvement in functional ability    Baseline  43% limited    Status  Not Met      PT LONG TERM GOAL #4   Title  Pt will be able to ambulate all necessary community distances without limitaiton by ankle pain    Baseline  able    Status  Achieved            Plan - 11/24/17 0932    Clinical Impression Statement  Pt requested to d/c to independent strengthening program at this time. I provided pt with handout of the most important exercises for days when he is short on time but stressed the importance of further strengthening and going to a gym. Encouraged pt to contact us with any further questions.     PT Treatment/Interventions  ADLs/Self Care Home  Management;Cryotherapy;Electrical Stimulation;Iontophoresis 29m/ml Dexamethasone;Functional mobility training;Stair training;Gait training;Ultrasound;Traction;Moist Heat;Therapeutic activities;Therapeutic exercise;Balance training;Neuromuscular re-education;Patient/family education;Passive range of motion;Manual techniques;Dry needling;Taping;Vasopneumatic Device    Consulted and Agree with Plan of Care  Patient       Patient will benefit from skilled therapeutic intervention in order to improve the following deficits and impairments:  Abnormal gait, Decreased range of motion, Difficulty walking, Increased muscle spasms, Decreased activity tolerance, Pain, Improper body mechanics, Impaired flexibility, Decreased balance, Decreased strength, Decreased mobility, Increased edema, Postural dysfunction  Visit Diagnosis: Pain in left ankle and joints of left foot  Difficulty in walking, not elsewhere classified  Muscle weakness (generalized)  Localized edema     Problem List Patient Active Problem List   Diagnosis Date Noted  . Displaced intraarticular fracture of left calcaneus, initial encounter for closed fracture 08/07/2017  . Viral syndrome 12/19/2014  . Otitis media 12/19/2014  . Testicular pain, right 07/12/2012  . Hyperglycemia 03/13/2012  . HTN (hypertension) 03/13/2012  . Hyperlipidemia 06/26/2009  . FATTY LIVER DISEASE 06/26/2009  . TRANSAMINASES, SERUM, ELEVATED 06/26/2009   PHYSICAL THERAPY DISCHARGE SUMMARY  Visits from Start of Care: 17  Current functional level related to goals / functional outcomes: See above   Remaining deficits: See above   Education / Equipment: Anatomy of condition, POC, HEP, exercise form/rationale  Plan: Patient agrees to discharge.  Patient goals were partially met. Patient is being discharged due to the patient's request.  ?????      Kelita Wallis C. Arriyana Rodell PT, DPT 11/24/17 9:36 AM   [facility] [address]Phone: [phone]  Fax:  [phone] Name: [name] FERRUFINOMRN: [id number] of Birth: 401-28-1977

## 2017-11-28 ENCOUNTER — Encounter: Payer: BC Managed Care – PPO | Admitting: Physical Therapy

## 2017-12-05 ENCOUNTER — Other Ambulatory Visit: Payer: Self-pay | Admitting: Family

## 2017-12-12 ENCOUNTER — Telehealth: Payer: Self-pay | Admitting: Family

## 2017-12-12 MED ORDER — HYDROCHLOROTHIAZIDE 25 MG PO TABS: 25.0000 mg | ORAL_TABLET | Freq: Every day | ORAL | 0 refills | Status: DC

## 2017-12-12 NOTE — Telephone Encounter (Signed)
Noted  

## 2017-12-12 NOTE — Telephone Encounter (Signed)
Copied from CRM [phone]#[id number]. Topic: Quick Communication - Rx Refill/Question >> Dec 12, 2017 11:10 AM Viviann SpareWhite, Selina wrote: Has the patient contacted their pharmacy? Yes.   hydrochlorothiazide (HYDRODIURIL) 25 MG tablet   (Agent: If no, request that the patient contact the pharmacy for the refill.)  Preferred Pharmacy (with phone number or street name):  [id number]- [place], [place] - [address] [place] [address] Phone: [phone] Fax: [phone]   Agent: Please be advised that RX refills may take up to 3 business days. We ask that you follow-up with your pharmacy.

## 2017-12-12 NOTE — Telephone Encounter (Signed)
Too soon to refill the Hydrodiuril.

## 2017-12-12 NOTE — Telephone Encounter (Signed)
Spoke with pt. He states he only has enough HCTZ for 1 more week and he will be out. He states he only takes 1 every day. Has not taken more than prescribed but supply is short. Advised him it it too soon for insurance to cover and he may have to talk with insurance to see if they will override early refill or he can talk to pharmacy about paying out of pocket and possible short supply on original Rx. 30 day supply sent today.

## 2018-01-07 ENCOUNTER — Telehealth: Payer: Self-pay | Admitting: *Deleted

## 2018-01-07 MED ORDER — HYDROCHLOROTHIAZIDE 25 MG PO TABS: 25.0000 mg | ORAL_TABLET | Freq: Every day | ORAL | 0 refills | Status: DC

## 2018-01-07 NOTE — Telephone Encounter (Signed)
[facility] from [facility] requesting 90 day supply of HCTZ. Rx sent.

## 2018-04-03 ENCOUNTER — Encounter: Payer: BC Managed Care – PPO | Admitting: Family

## 2018-04-08 ENCOUNTER — Encounter: Payer: Self-pay | Admitting: Family

## 2018-04-08 ENCOUNTER — Telehealth: Payer: Self-pay | Admitting: Family

## 2018-04-08 ENCOUNTER — Ambulatory Visit: Payer: BC Managed Care – PPO | Admitting: Family

## 2018-04-08 VITALS — BP 148/85 | HR 98 | Temp 98.3°F | Resp 16 | Ht 70.0 in | Wt 213.0 lb

## 2018-04-08 DIAGNOSIS — E785 Hyperlipidemia, unspecified: Secondary | ICD-10-CM | POA: Diagnosis not present

## 2018-04-08 DIAGNOSIS — I10 Essential (primary) hypertension: Secondary | ICD-10-CM | POA: Diagnosis not present

## 2018-04-08 LAB — LIPID PANEL
CHOLESTEROL: 158 mg/dL (ref 0–200)
HDL: 35.7 mg/dL — ABNORMAL LOW (ref >39.00)
LDL CALC: 90 mg/dL (ref 0–99)
NonHDL: 122.58
Total CHOL/HDL Ratio: 4
Triglycerides: 161 mg/dL — ABNORMAL HIGH (ref 0.0–149.0)
VLDL: 32.2 mg/dL (ref 0.0–40.0)

## 2018-04-08 LAB — BASIC METABOLIC PANEL
BUN: 11 mg/dL (ref 6–23)
CHLORIDE: 98 meq/L (ref 96–112)
CO2: 32 mEq/L (ref 19–32)
Calcium: 9.8 mg/dL (ref 8.4–10.5)
Creatinine, Ser: 1 mg/dL (ref 0.40–1.50)
GFR: 86.68 mL/min (ref >60.00)
Glucose, Bld: 182 mg/dL — ABNORMAL HIGH (ref 70–99)
POTASSIUM: 3 meq/L — AB (ref 3.5–5.1)
Sodium: 138 mEq/L (ref 135–145)

## 2018-04-08 MED ORDER — ATORVASTATIN CALCIUM 20 MG PO TABS: 20.0000 mg | ORAL_TABLET | Freq: Every day | ORAL | 3 refills | Status: DC

## 2018-04-08 MED ORDER — POTASSIUM CHLORIDE CRYS ER 20 MEQ PO TBCR
EXTENDED_RELEASE_TABLET | ORAL | 0 refills | Status: DC
Start: 2018-04-08 — End: 2018-05-05

## 2018-04-08 NOTE — Patient Instructions (Signed)
Stop livalo, start atorvastatin. Check blood pressure once daily for one week, then send me your readings via mychart.   If readings remain >140/90 then we will need to adjust your blood pressure medication.

## 2018-04-08 NOTE — Progress Notes (Signed)
Subjective:    Patient ID: [name] [name], male    DOB: [birth year], 42 y.o.   MRN: [id number]  HPI  [name] [name] is a 42 yr old male who presents today for follow up.  Hyperlipidemia- maintained on livalo.  He reports that despite the $18 co-pay card his out-of-pocket expense at the pharmacy was going to be $300.  He is currently taking Livalo but wishes to switch to a atorvastatin. Lab Results  Component Value Date   CHOL 139 09/29/2017   HDL 35.20 (L) 09/29/2017   LDLCALC 69 09/29/2017   LDLDIRECT 152.4 07/10/2010   TRIG 172.0 (H) 09/29/2017   CHOLHDL 4 09/29/2017   HTN- Maintained on hctz. Reports tolerating without difficulty.   Denies CP/SOB or swelling.  He reports that he has good compliance with his blood pressure medication.  BP Readings from Last 3 Encounters:  04/08/18 (!) 148/91  09/29/17 138/86  07/31/17 (!) 159/87     Review of Systems See HPI  Past Medical History:  Diagnosis Date  . Abnormal LFTs (liver function tests)    secondary to lovastatin and fatty liver  . Calcaneal fracture 07/31/2017   left  . Fatty liver    history of fatty liver  . Hyperlipidemia   . Hypertension      Social History   Socioeconomic History  . Marital status: Married    Spouse name: Not on file  . Number of children: Not on file  . Years of education: Not on file  . Highest education level: Not on file  Occupational History  . Not on file  Social Needs  . Financial resource strain: Not on file  . Food insecurity:    Worry: Not on file    Inability: Not on file  . Transportation needs:    Medical: Not on file    Non-medical: Not on file  Tobacco Use  . Smoking status: Never Smoker  . Smokeless tobacco: Never Used  Substance and Sexual Activity  . Alcohol use: No    Alcohol/week: 0.0 oz  . Drug use: No  . Sexual activity: Not on file  Lifestyle  . Physical activity:    Days per week: Not on file    Minutes per session: Not on file  . Stress: Not on file    Relationships  . Social connections:    Talks on phone: Not on file    Gets together: Not on file    Attends religious service: Not on file    Active member of club or organization: Not on file    Attends meetings of clubs or organizations: Not on file    Relationship status: Not on file  . Intimate partner violence:    Fear of current or ex partner: Not on file    Emotionally abused: Not on file    Physically abused: Not on file    Forced sexual activity: Not on file  Other Topics Concern  . 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  Family History  Problem Relation Age of Onset  . Diabetes Mother   . Hypertension Mother   . Stroke Maternal Grandfather   . Coronary artery disease Maternal Uncle   . Other  Neg Hx        no FH of colon cancer    No Known Allergies  Current Outpatient Medications on File Prior to Visit  Medication Sig Dispense Refill  . aspirin EC 81 MG tablet Take 81 mg by mouth daily.    . hydrochlorothiazide (HYDRODIURIL) 25 MG tablet Take 1 tablet (25 mg total) by mouth daily. 90 tablet 0  . naproxen (NAPROSYN) 500 MG tablet Take 1 tablet (500 mg total) by mouth 2 (two) times daily with a meal. 30 tablet 0  . Omega-3 Fatty Acids (FISH OIL) 1000 MG CAPS Take 2,000 mg by mouth daily.      No current facility-administered medications on file prior to visit.     BP (!) 148/91 (BP Location: Right Arm, Patient Position: Sitting, Cuff Size: Small)   Pulse 98   Temp 98.3 F (36.8 C) (Oral)   Resp 16   Ht 5\' 10"  (1.778 m)   Wt 213 lb (96.6 kg)   SpO2 98%   BMI 30.56 kg/m       Objective:   Physical Exam  Constitutional: He is oriented to person, place, and time. He appears well-developed and well-nourished. No distress.  HENT:  Head: Normocephalic and  atraumatic.  Cardiovascular: Normal rate and regular rhythm.  No murmur heard. Pulmonary/Chest: Effort normal and breath sounds normal. No respiratory distress. He has no wheezes. He has no rales.  Musculoskeletal: He exhibits no edema.  Neurological: He is alert and oriented to person, place, and time.  Skin: Skin is warm and dry.  Psychiatric: He has a normal mood and affect. His behavior is normal. Thought content normal.          Assessment & Plan:  Hypertension-patient wishes to check his readings at home.  He feels that he may have an element of whitecoat hypertension.  I have asked him to check his blood pressures once daily for the next week and then send me his readings via my chart.  Follow-up basic metabolic panel reveals hypokalemia.  Will replete with K Dur.  Please see phone note.  Hyperlipidemia-we will change from Livalo to a atorvastatin at patient request.  Obtain follow-up lipid panel.

## 2018-04-08 NOTE — Telephone Encounter (Signed)
Potassium is low.  I would recommend that he start a potassium supplement.  Take 2 tabs of potassium today, 2 tabs tomorrow, then 1 tablet once daily.  I would recommend that he have a follow-up visit with the nurse in 2 weeks so that we can recheck his blood pressure and a basic metabolic panel.

## 2018-04-09 ENCOUNTER — Other Ambulatory Visit: Payer: Self-pay

## 2018-04-09 DIAGNOSIS — E876 Hypokalemia: Secondary | ICD-10-CM

## 2018-04-09 NOTE — Telephone Encounter (Signed)
Ok

## 2018-04-09 NOTE — Telephone Encounter (Signed)
FYI Talked to patient and he will start the potasium as indicated. He will like to get a bp monitor and check the bp at home instead of coming in here. He said he can send Melissa the information on MyChart. Order for BMP entered as future for 2 weeks, he will call back to schedule.

## 2018-04-12 ENCOUNTER — Other Ambulatory Visit: Payer: Self-pay | Admitting: Family

## 2018-04-29 ENCOUNTER — Encounter: Payer: Self-pay | Admitting: Family

## 2018-05-01 ENCOUNTER — Other Ambulatory Visit (INDEPENDENT_AMBULATORY_CARE_PROVIDER_SITE_OTHER): Payer: BC Managed Care – PPO

## 2018-05-01 DIAGNOSIS — E876 Hypokalemia: Secondary | ICD-10-CM

## 2018-05-01 LAB — BASIC METABOLIC PANEL
BUN: 12 mg/dL (ref 7–25)
CO2: 29 mmol/L (ref 20–32)
Calcium: 9.8 mg/dL (ref 8.6–10.3)
Chloride: 103 mmol/L (ref 98–110)
Creat: 1.05 mg/dL (ref 0.60–1.35)
Glucose, Bld: 101 mg/dL — ABNORMAL HIGH (ref 65–99)
POTASSIUM: 3.8 mmol/L (ref 3.5–5.3)
Sodium: 142 mmol/L (ref 135–146)

## 2018-05-03 ENCOUNTER — Encounter: Payer: Self-pay | Admitting: Family

## 2018-05-05 ENCOUNTER — Other Ambulatory Visit: Payer: Self-pay | Admitting: Family

## 2018-07-09 ENCOUNTER — Ambulatory Visit: Payer: BC Managed Care – PPO | Admitting: Family

## 2018-07-16 ENCOUNTER — Other Ambulatory Visit: Payer: Self-pay | Admitting: Family

## 2018-07-27 ENCOUNTER — Other Ambulatory Visit: Payer: Self-pay | Admitting: Family

## 2018-07-31 ENCOUNTER — Ambulatory Visit: Payer: BC Managed Care – PPO | Admitting: Family

## 2018-07-31 ENCOUNTER — Encounter: Payer: Self-pay | Admitting: Family

## 2018-07-31 VITALS — BP 130/82 | HR 64 | Temp 98.2°F | Resp 16 | Ht 70.0 in | Wt 219.2 lb

## 2018-07-31 DIAGNOSIS — R739 Hyperglycemia, unspecified: Secondary | ICD-10-CM

## 2018-07-31 DIAGNOSIS — E785 Hyperlipidemia, unspecified: Secondary | ICD-10-CM

## 2018-07-31 DIAGNOSIS — E1159 Type 2 diabetes mellitus with other circulatory complications: Secondary | ICD-10-CM | POA: Diagnosis not present

## 2018-07-31 DIAGNOSIS — I10 Essential (primary) hypertension: Secondary | ICD-10-CM | POA: Diagnosis not present

## 2018-07-31 DIAGNOSIS — I152 Hypertension secondary to endocrine disorders: Secondary | ICD-10-CM

## 2018-07-31 LAB — BASIC METABOLIC PANEL
BUN: 10 mg/dL (ref 6–23)
CALCIUM: 9.8 mg/dL (ref 8.4–10.5)
CO2: 31 mEq/L (ref 19–32)
Chloride: 102 mEq/L (ref 96–112)
Creatinine, Ser: 0.97 mg/dL (ref 0.40–1.50)
GFR: 89.65 mL/min (ref >60.00)
GLUCOSE: 167 mg/dL — AB (ref 70–99)
Potassium: 3.5 mEq/L (ref 3.5–5.1)
SODIUM: 140 meq/L (ref 135–145)

## 2018-07-31 LAB — HEMOGLOBIN A1C: Hgb A1c MFr Bld: 6.1 % (ref 4.6–6.5)

## 2018-07-31 MED ORDER — ATORVASTATIN CALCIUM 20 MG PO TABS: 20.0000 mg | ORAL_TABLET | Freq: Every day | ORAL | 1 refills | Status: DC

## 2018-07-31 MED ORDER — HYDROCHLOROTHIAZIDE 25 MG PO TABS: 25.0000 mg | ORAL_TABLET | Freq: Every day | ORAL | 1 refills | Status: DC

## 2018-07-31 NOTE — Progress Notes (Signed)
Subjective:    Patient ID: [name] [name], male    DOB: 1976-05-03, 42 y.o.   MRN: [id number]  HPI  Pt presents today for follow up.    HTN- Continues hctz and Kdur.  Denies leg swelling, CP or SOB  BP Readings from Last 3 Encounters:  07/31/18 130/82  04/08/18 (!) 148/85  09/29/17 138/86   Hyperlipidemia- maintained on lipitor. Tolerating without difficulty. Lab Results  Component Value Date   CHOL 158 04/08/2018   HDL 35.70 (L) 04/08/2018   LDLCALC 90 04/08/2018   LDLDIRECT 152.4 07/10/2010   TRIG 161.0 (H) 04/08/2018   CHOLHDL 4 04/08/2018   Hyperglycemia- Lab Results  Component Value Date   HGBA1C 5.6 07/03/2012     Review of Systems    see HPI  Past Medical History:  Diagnosis Date  . Abnormal LFTs (liver function tests)    secondary to lovastatin and fatty liver  . Calcaneal fracture 07/31/2017   left  . Fatty liver    history of fatty liver  . Hyperlipidemia   . Hypertension      Social History   Socioeconomic History  . Marital status: Married    Spouse name: Not on file  . Number of children: Not on file  . Years of education: Not on file  . Highest education level: Not on file  Occupational History  . Not on file  Social Needs  . Financial resource strain: Not on file  . Food insecurity:    Worry: Not on file    Inability: Not on file  . Transportation needs:    Medical: Not on file    Non-medical: Not on file  Tobacco Use  . Smoking status: Never Smoker  . Smokeless tobacco: Never Used  Substance and Sexual Activity  . Alcohol use: No    Alcohol/week: 0.0 oz  . Drug use: No  . Sexual activity: Not on file  Lifestyle  . Physical activity:    Days per week: Not on file    Minutes per session: Not on file  . Stress: Not on file  Relationships  . Social connections:    Talks on phone: Not on file    Gets together: Not on file    Attends religious service: Not on file    Active member of club or organization: Not on file   Attends meetings of clubs or organizations: Not on file    Relationship status: Not on file  . Intimate partner violence:    Fear of current or ex partner: Not on file    Emotionally abused: Not on file    Physically abused: Not on file    Forced sexual activity: Not on file  Other Topics Concern  . 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  Family History  Problem Relation Age of Onset  . Diabetes Mother   . Hypertension Mother   . Stroke Maternal Grandfather   . Coronary artery disease Maternal Uncle   . Other Neg Hx        no FH of colon cancer    No Known Allergies  Current Outpatient Medications on File Prior to Visit  Medication Sig Dispense Refill  . aspirin EC  81 MG tablet Take 81 mg by mouth daily.    . naproxen (NAPROSYN) 500 MG tablet Take 1 tablet (500 mg total) by mouth 2 (two) times daily with a meal. 30 tablet 0  . Omega-3 Fatty Acids (FISH OIL) 1000 MG CAPS Take 2,000 mg by mouth daily.     . potassium chloride SA (KLOR-CON M20) 20 MEQ tablet TAKE 2 TABS BY MOUTH TODAY, 2 TABS BY MOUTH TOMORROW, THEN 1 TABLET DAILY THEREAFTER. 34 tablet 5   No current facility-administered medications on file prior to visit.     BP 130/82 (BP Location: Left Arm, Cuff Size: Large)   Pulse 64   Temp 98.2 F (36.8 C) (Oral)   Resp 16   Ht 5\' 10"  (1.778 m)   Wt 219 lb 3.2 oz (99.4 kg)   SpO2 97%   BMI 31.45 kg/m    Objective:   Physical Exam  Constitutional: He is oriented to person, place, and time. He appears well-developed and well-nourished. No distress.  HENT:  Head: Normocephalic and atraumatic.  Cardiovascular: Normal rate and regular rhythm.  No murmur heard. Pulmonary/Chest: Effort normal and breath sounds normal. No respiratory distress. He has no wheezes. He has no  rales.  Musculoskeletal: He exhibits no edema.  Neurological: He is alert and oriented to person, place, and time.  Skin: Skin is warm and dry.  Psychiatric: He has a normal mood and affect. His behavior is normal. Thought content normal.          Assessment & Plan:  HTN- bp stable on current meds. Continue same. Obtain follow up bmet.  Hyperglycemia- asymptomatic, obtain follow up A1C.  Hyperlipidemia- LDL at goal, continue statin.

## 2018-07-31 NOTE — Patient Instructions (Signed)
Please complete lab work prior to leaving.   

## 2018-08-07 ENCOUNTER — Ambulatory Visit (INDEPENDENT_AMBULATORY_CARE_PROVIDER_SITE_OTHER): Payer: BC Managed Care – PPO | Admitting: Family

## 2018-08-07 ENCOUNTER — Encounter: Payer: Self-pay | Admitting: Family

## 2018-08-07 VITALS — BP 126/85 | HR 63 | Temp 98.1°F | Resp 16 | Ht 70.0 in | Wt 215.0 lb

## 2018-08-07 DIAGNOSIS — Z0000 Encounter for general adult medical examination without abnormal findings: Secondary | ICD-10-CM

## 2018-08-07 LAB — CBC WITH DIFFERENTIAL/PLATELET
Basophils Absolute: 0 K/uL (ref 0.0–0.1)
Basophils Relative: 0.5 % (ref 0.0–3.0)
Eosinophils Absolute: 0.1 K/uL (ref 0.0–0.7)
Eosinophils Relative: 2.1 % (ref 0.0–5.0)
HCT: 44.4 % (ref 39.0–52.0)
Hemoglobin: 15.4 g/dL (ref 13.0–17.0)
Lymphocytes Relative: 38.3 % (ref 12.0–46.0)
Lymphs Abs: 2.4 K/uL (ref 0.7–4.0)
MCHC: 34.8 g/dL (ref 30.0–36.0)
MCV: 87.4 fl (ref 78.0–100.0)
Monocytes Absolute: 0.4 K/uL (ref 0.1–1.0)
Monocytes Relative: 5.9 % (ref 3.0–12.0)
Neutro Abs: 3.3 K/uL (ref 1.4–7.7)
Neutrophils Relative %: 53.2 % (ref 43.0–77.0)
Platelets: 224 K/uL (ref 150.0–400.0)
RBC: 5.08 Mil/uL (ref 4.22–5.81)
RDW: 13 % (ref 11.5–15.5)
WBC: 6.3 K/uL (ref 4.0–10.5)

## 2018-08-07 LAB — URINALYSIS, ROUTINE W REFLEX MICROSCOPIC
Bilirubin Urine: NEGATIVE
HGB URINE DIPSTICK: NEGATIVE
KETONES UR: NEGATIVE
LEUKOCYTES UA: NEGATIVE
NITRITE: NEGATIVE
RBC / HPF: NONE SEEN (ref 0–?)
Specific Gravity, Urine: 1.015 (ref 1.000–1.030)
Total Protein, Urine: NEGATIVE
UROBILINOGEN UA: 0.2 (ref 0.0–1.0)
Urine Glucose: NEGATIVE
WBC UA: NONE SEEN (ref 0–?)
pH: 6 (ref 5.0–8.0)

## 2018-08-07 LAB — HEPATIC FUNCTION PANEL
ALT: 62 U/L — ABNORMAL HIGH (ref 0–53)
AST: 29 U/L (ref 0–37)
Albumin: 4.6 g/dL (ref 3.5–5.2)
Alkaline Phosphatase: 45 U/L (ref 39–117)
Bilirubin, Direct: 0.2 mg/dL (ref 0.0–0.3)
Total Bilirubin: 1.1 mg/dL (ref 0.2–1.2)
Total Protein: 7.1 g/dL (ref 6.0–8.3)

## 2018-08-07 LAB — TSH: TSH: 0.59 u[IU]/mL (ref 0.35–4.50)

## 2018-08-07 NOTE — Patient Instructions (Addendum)
Please complete lab work prior to leaving. Continue to work on healthy diet, exercise and weight loss.  

## 2018-08-07 NOTE — Progress Notes (Signed)
Subjective:    Patient ID: [name] [name], male    DOB: [birth year], 42 y.o.   MRN: [id number]  HPI  Patient presents today for complete physical.  Immunizations: tetanus 2011 Diet: trying to work on diet Exercise:   Vision: last year Dental: 3 months ago  Wt Readings from Last 3 Encounters:  08/07/18 215 lb (97.5 kg)  07/31/18 219 lb 3.2 oz (99.4 kg)  04/08/18 213 lb (96.6 kg)       Review of Systems  Constitutional: Negative for unexpected weight change.  HENT: Negative for hearing loss and rhinorrhea.   Eyes: Negative for visual disturbance.  Respiratory: Negative for cough and shortness of breath.   Cardiovascular: Negative for chest pain.  Gastrointestinal: Negative for blood in stool, constipation, diarrhea, nausea and vomiting.  Genitourinary: Negative for dysuria, frequency and hematuria.  Musculoskeletal: Negative for arthralgias and myalgias.  Skin: Negative for rash.  Neurological: Negative for headaches.  Hematological: Negative for adenopathy.  Psychiatric/Behavioral:       Denies depression/anxiety     Past Medical History:  Diagnosis Date  . Abnormal LFTs (liver function tests)    secondary to lovastatin and fatty liver  . Calcaneal fracture 07/31/2017   left  . Fatty liver    history of fatty liver  . Hyperlipidemia   . Hypertension      Social History   Socioeconomic History  . Marital status: Married    Spouse name: Not on file  . Number of children: Not on file  . Years of education: Not on file  . Highest education level: Not on file  Occupational History  . Not on file  Social Needs  . Financial resource strain: Not on file  . Food insecurity:    Worry: Not on file    Inability: Not on file  . Transportation needs:    Medical: Not on file    Non-medical: Not on file  Tobacco Use  . Smoking status: Never Smoker  . Smokeless tobacco: Never Used  Substance and Sexual Activity  . Alcohol use: No    Alcohol/week: 0.0 standard  drinks  . Drug use: No  . Sexual activity: Not on file  Lifestyle  . Physical activity:    Days per week: Not on file    Minutes per session: Not on file  . Stress: Not on file  Relationships  . Social connections:    Talks on phone: Not on file    Gets together: Not on file    Attends religious service: Not on file    Active member of club or organization: Not on file    Attends meetings of clubs or organizations: Not on file    Relationship status: Not on file  . Intimate partner violence:    Fear of current or ex partner: Not on file    Emotionally abused: Not on file    Physically abused: Not on file    Forced sexual activity: Not on file  Other Topics Concern  . 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  Family History  Problem Relation Age of Onset  . Diabetes Mother   . Hypertension Mother   . Stroke Maternal Grandfather   .  Coronary artery disease Maternal Uncle   . Other Neg Hx        no FH of colon cancer    No Known Allergies  Current Outpatient Medications on File Prior to Visit  Medication Sig Dispense Refill  . aspirin EC 81 MG tablet Take 81 mg by mouth daily.    Marland Kitchen. atorvastatin (LIPITOR) 20 MG tablet Take 1 tablet (20 mg total) by mouth daily. 90 tablet 1  . hydrochlorothiazide (HYDRODIURIL) 25 MG tablet Take 1 tablet (25 mg total) by mouth daily. 90 tablet 1  . naproxen (NAPROSYN) 500 MG tablet Take 1 tablet (500 mg total) by mouth 2 (two) times daily with a meal. 30 tablet 0  . Omega-3 Fatty Acids (FISH OIL) 1000 MG CAPS Take 2,000 mg by mouth daily.     . potassium chloride SA (KLOR-CON M20) 20 MEQ tablet TAKE 2 TABS BY MOUTH TODAY, 2 TABS BY MOUTH TOMORROW, THEN 1 TABLET DAILY THEREAFTER. 34 tablet 5   No current facility-administered medications on file prior to  visit.     BP 126/85 (BP Location: Left Arm, Patient Position: Sitting, Cuff Size: Large)   Pulse 63   Temp 98.1 F (36.7 C) (Oral)   Resp 16   Ht 5\' 10"  (1.778 m)   Wt 215 lb (97.5 kg)   SpO2 97%   BMI 30.85 kg/m        Objective:   Physical Exam  Constitutional: He is oriented to person, place, and time. He appears well-developed and well-nourished. No distress.  HENT:  Head: Normocephalic and atraumatic.  Cardiovascular: Normal rate and regular rhythm.  No murmur heard. Pulmonary/Chest: Effort normal and breath sounds normal. No respiratory distress. He has no wheezes. He has no rales.  Musculoskeletal: He exhibits no edema.  Neurological: He is alert and oriented to person, place, and time.  Skin: Skin is warm and dry.  Psychiatric: He has a normal mood and affect. His behavior is normal. Thought content normal.          Assessment & Plan:  Preventative care- discussed importance of reducing carbs/sugars in diet, continuing exercise and weight loss. EKG tracing is personally reviewed.  EKG notes NSR.  No acute changes. Obtain routine lab work.

## 2018-10-21 IMAGING — DX DG WRIST COMPLETE 3+V*R*
4 series · 4 of 4 positions shown · non-contrast
Comparison: None.

CLINICAL DATA: Right wrist pain and swelling for 3 days. No known
injury.

EXAM:
RIGHT WRIST - COMPLETE 3+ VIEW

[wrist pa]
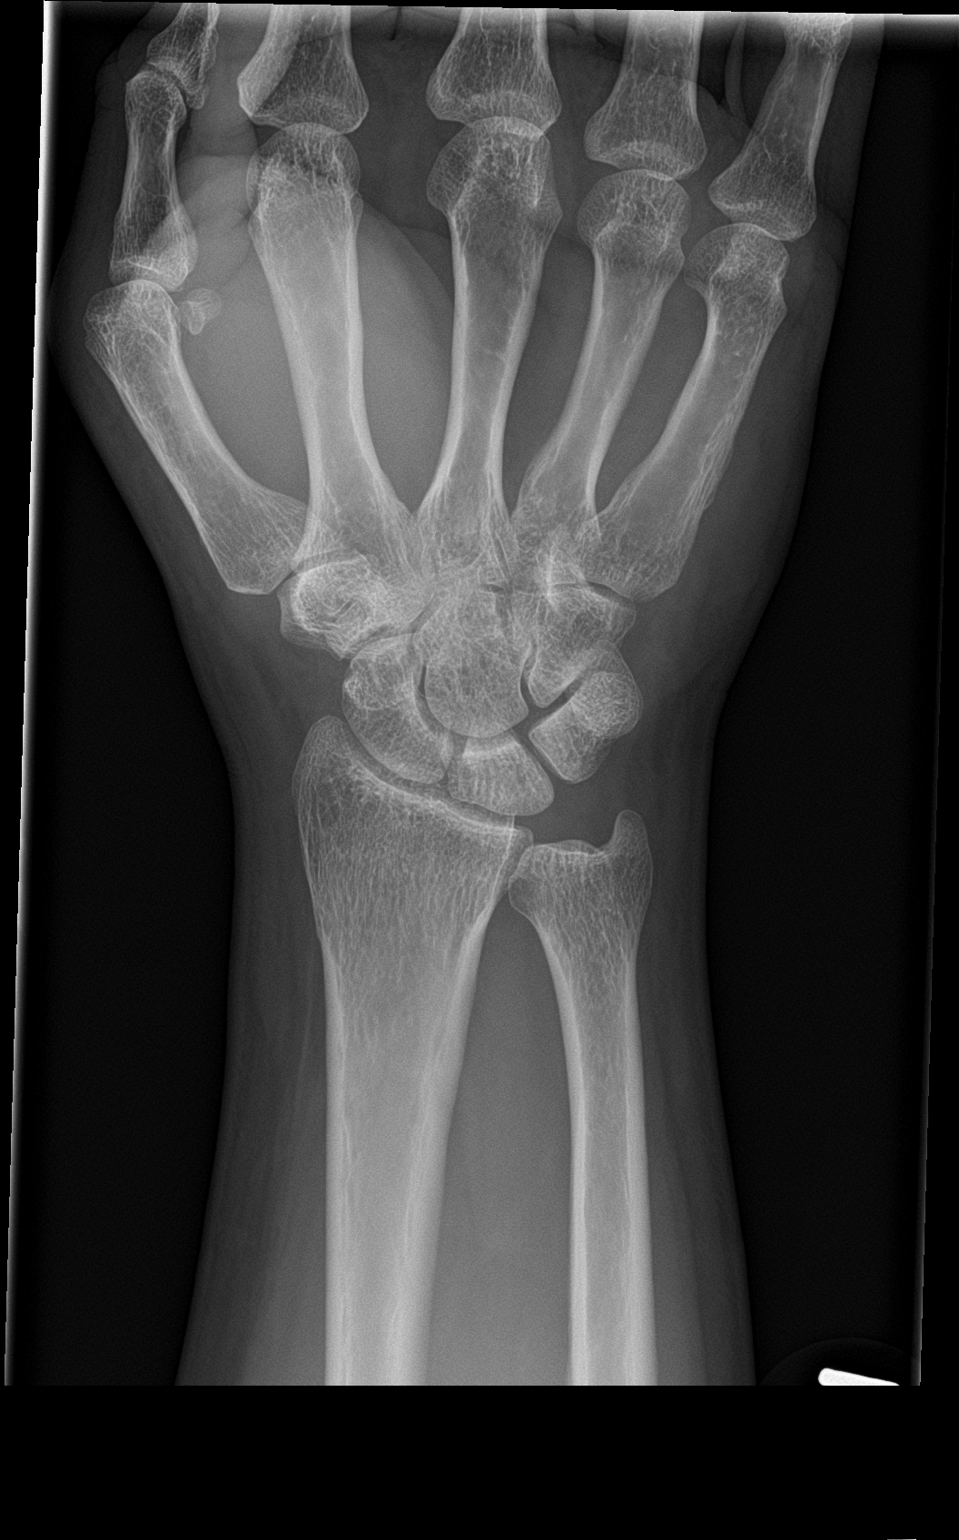

[wrist obl]
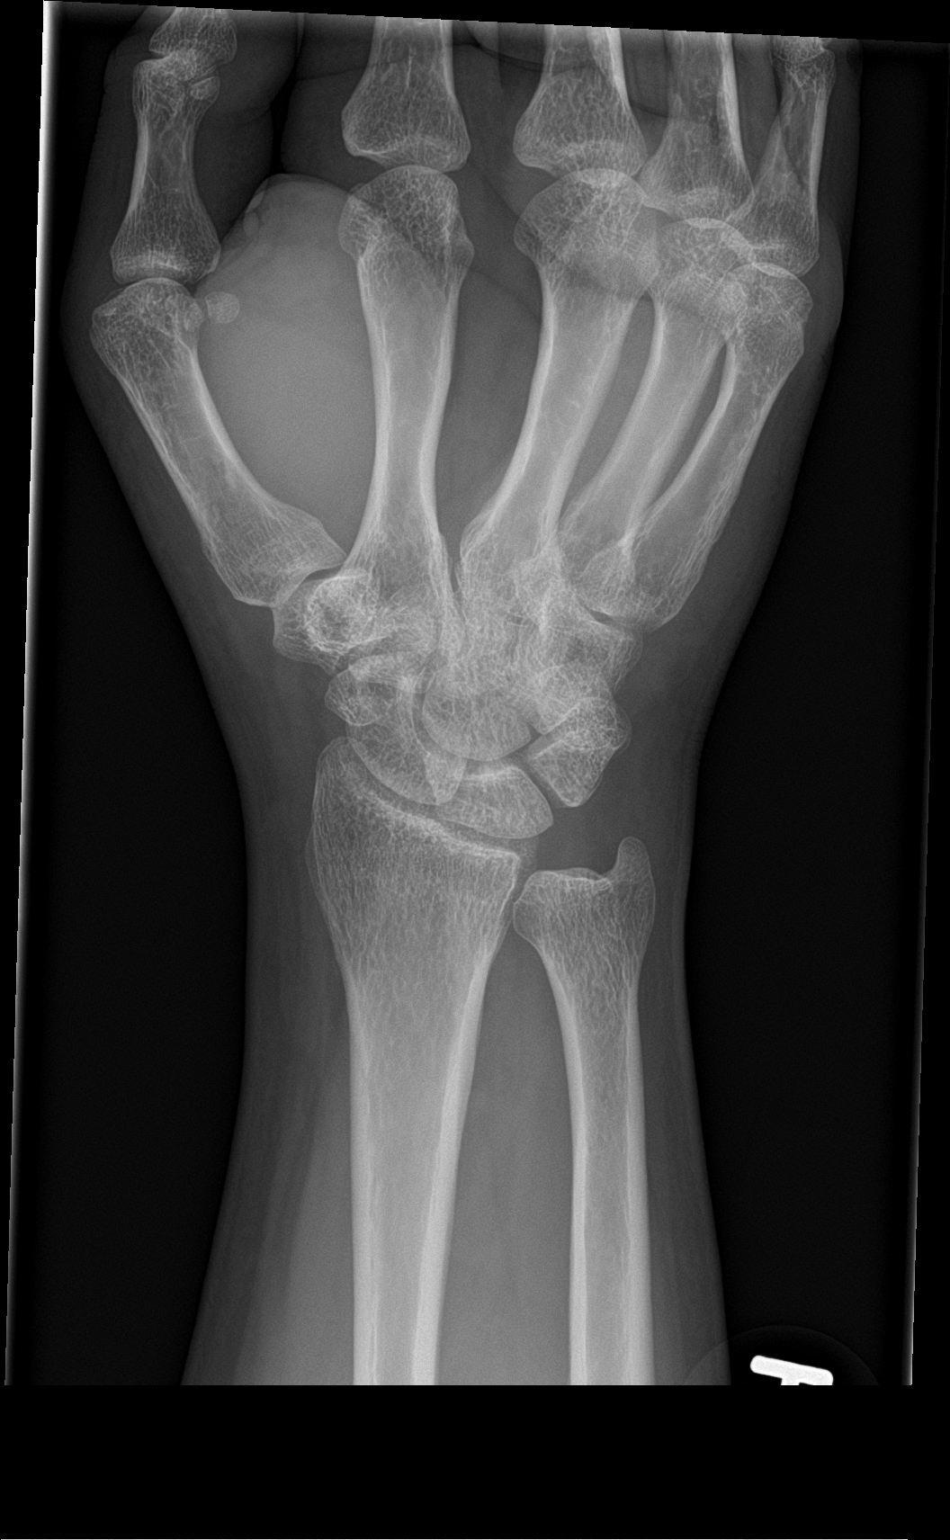

[wrist lat]
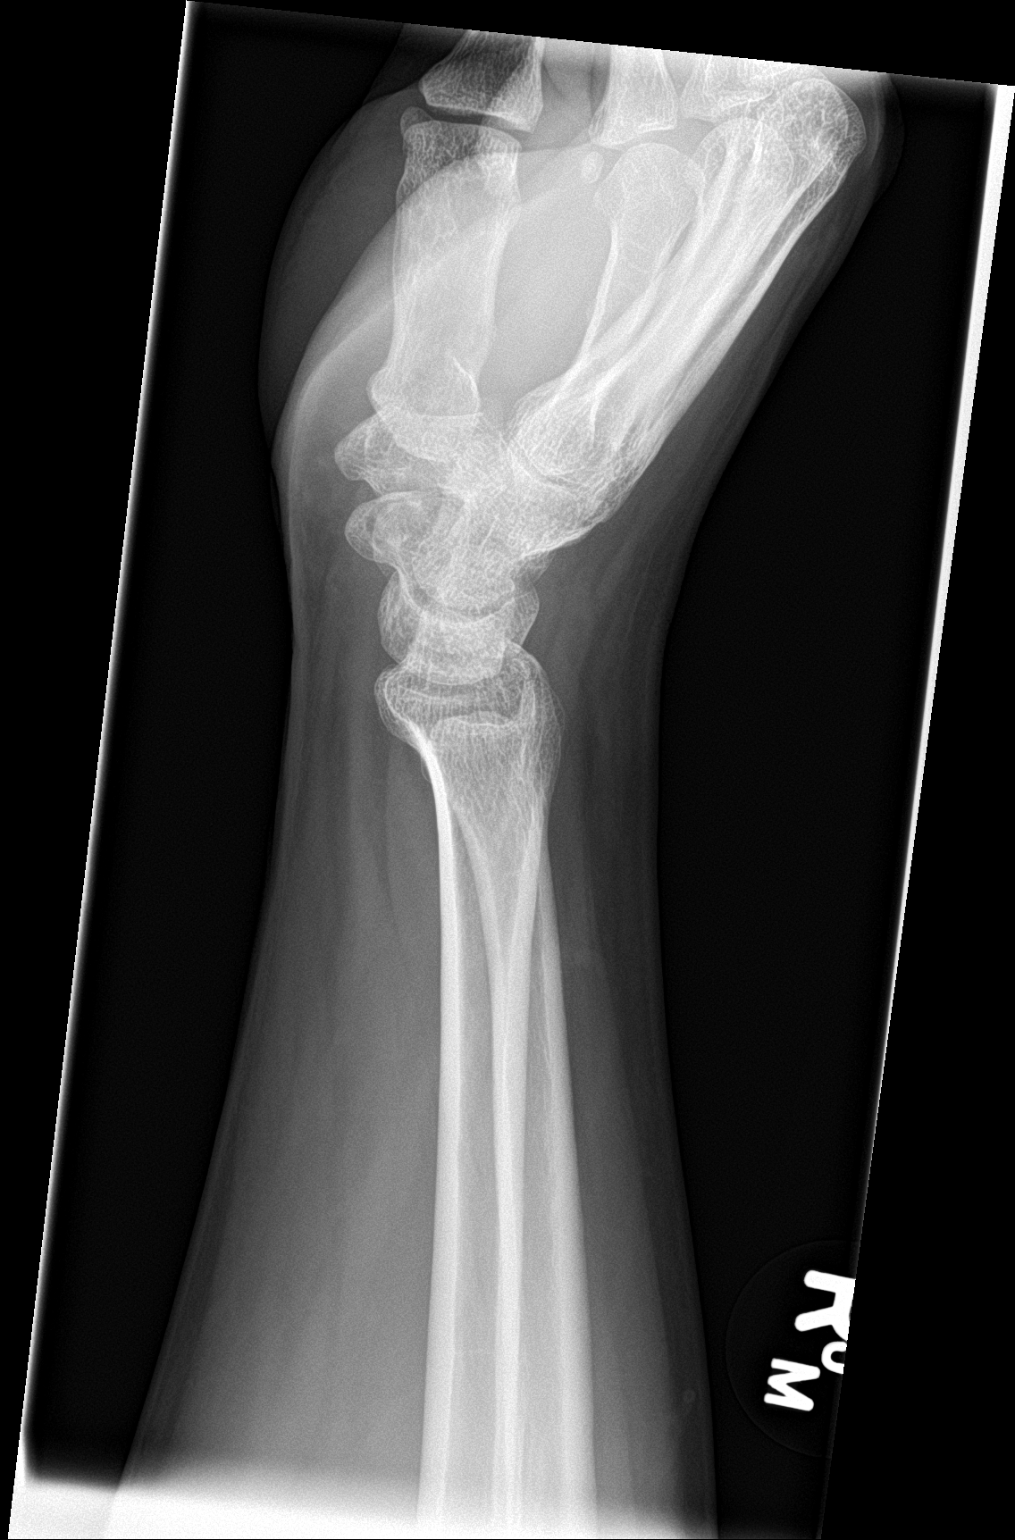

[wrist navicular]
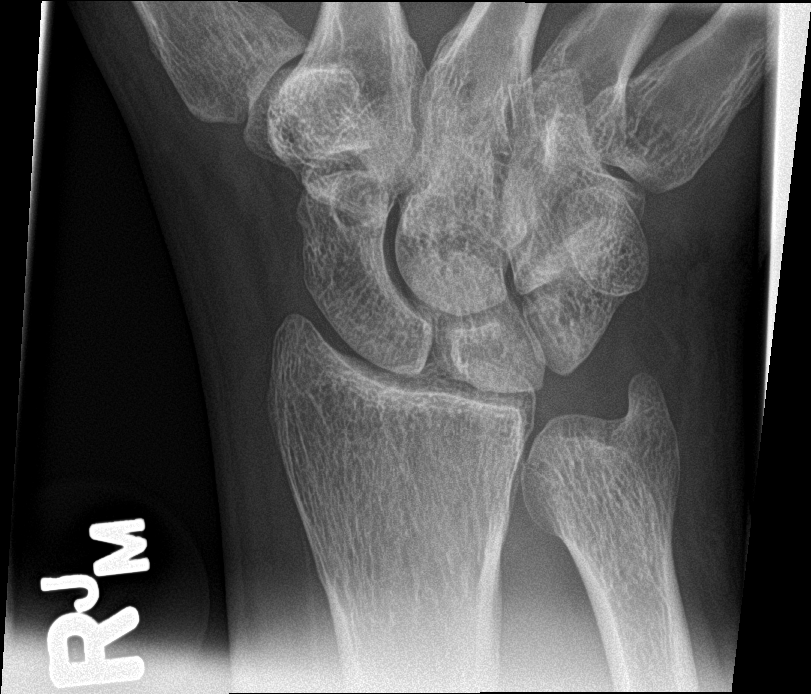

[4 of 4 positions shown; findings below may reference images not displayed]

FINDINGS: There is no evidence of fracture or dislocation. There is no
evidence of arthropathy or other focal bone abnormality. Soft
tissues are unremarkable.
IMPRESSION: Negative.

## 2018-10-25 ENCOUNTER — Other Ambulatory Visit: Payer: Self-pay | Admitting: Family

## 2019-01-28 ENCOUNTER — Other Ambulatory Visit: Payer: Self-pay | Admitting: Family

## 2019-02-05 ENCOUNTER — Encounter: Payer: Self-pay | Admitting: Family

## 2019-02-05 ENCOUNTER — Ambulatory Visit: Payer: BC Managed Care – PPO | Admitting: Family

## 2019-02-05 VITALS — BP 132/89 | HR 64 | Temp 98.6°F | Resp 16 | Ht 71.0 in | Wt 216.0 lb

## 2019-02-05 DIAGNOSIS — E785 Hyperlipidemia, unspecified: Secondary | ICD-10-CM | POA: Diagnosis not present

## 2019-02-05 DIAGNOSIS — I10 Essential (primary) hypertension: Secondary | ICD-10-CM

## 2019-02-05 DIAGNOSIS — R739 Hyperglycemia, unspecified: Secondary | ICD-10-CM

## 2019-02-05 LAB — LIPID PANEL
CHOLESTEROL: 123 mg/dL (ref 0–200)
HDL: 30.4 mg/dL — AB (ref >39.00)
LDL CALC: 70 mg/dL (ref 0–99)
NonHDL: 92.36
Total CHOL/HDL Ratio: 4
Triglycerides: 110 mg/dL (ref 0.0–149.0)
VLDL: 22 mg/dL (ref 0.0–40.0)

## 2019-02-05 LAB — COMPREHENSIVE METABOLIC PANEL
ALT: 66 U/L — AB (ref 0–53)
AST: 30 U/L (ref 0–37)
Albumin: 4.5 g/dL (ref 3.5–5.2)
Alkaline Phosphatase: 46 U/L (ref 39–117)
BUN: 12 mg/dL (ref 6–23)
CALCIUM: 9.4 mg/dL (ref 8.4–10.5)
CHLORIDE: 103 meq/L (ref 96–112)
CO2: 29 meq/L (ref 19–32)
Creatinine, Ser: 1 mg/dL (ref 0.40–1.50)
GFR: 81.24 mL/min (ref >60.00)
Glucose, Bld: 96 mg/dL (ref 70–99)
POTASSIUM: 3.6 meq/L (ref 3.5–5.1)
SODIUM: 141 meq/L (ref 135–145)
TOTAL PROTEIN: 6.7 g/dL (ref 6.0–8.3)
Total Bilirubin: 0.9 mg/dL (ref 0.2–1.2)

## 2019-02-05 LAB — MICROALBUMIN / CREATININE URINE RATIO
Creatinine,U: 149.8 mg/dL
Microalb Creat Ratio: 0.5 mg/g (ref 0.0–30.0)
Microalb, Ur: <0.7 mg/dL (ref 0.0–1.9)

## 2019-02-05 LAB — HEMOGLOBIN A1C: HEMOGLOBIN A1C: 5.9 % (ref 4.6–6.5)

## 2019-02-05 MED ORDER — ATORVASTATIN CALCIUM 20 MG PO TABS: 20.0000 mg | ORAL_TABLET | Freq: Every day | ORAL | 1 refills | Status: DC

## 2019-02-05 MED ORDER — HYDROCHLOROTHIAZIDE 25 MG PO TABS: 25.0000 mg | ORAL_TABLET | Freq: Every day | ORAL | 1 refills | Status: DC

## 2019-02-05 MED ORDER — POTASSIUM CHLORIDE CRYS ER 20 MEQ PO TBCR: EXTENDED_RELEASE_TABLET | ORAL | 1 refills | Status: DC

## 2019-02-05 NOTE — Progress Notes (Signed)
Subjective:    Patient ID: [name] [name], male    DOB: 11/21/1976, 43 y.o.   MRN: [id number]  HPI  Patient is a 43 yr old male who presents today for follow up.  HTN- maintained on hctz. Also on kdur. Denies CP/SOB or chest pain.  BP Readings from Last 3 Encounters:  02/05/19 132/89  08/07/18 126/85  07/31/18 130/82   Hyperlipidemia-  Continues fish oil.  Lab Results  Component Value Date   CHOL 158 04/08/2018   HDL 35.70 (L) 04/08/2018   LDLCALC 90 04/08/2018   LDLDIRECT 152.4 07/10/2010   TRIG 161.0 (H) 04/08/2018   CHOLHDL 4 04/08/2018    Hyperglycemia- reports that he is trying to work on his diet.  Lab Results  Component Value Date   HGBA1C 6.1 07/31/2018     Review of Systems See HPI  Past Medical History:  Diagnosis Date  . Abnormal LFTs (liver function tests)    secondary to lovastatin and fatty liver  . Calcaneal fracture 07/31/2017   left  . Fatty liver    history of fatty liver  . Hyperlipidemia   . Hypertension      Social History   Socioeconomic History  . Marital status: Married    Spouse name: Not on file  . Number of children: Not on file  . Years of education: Not on file  . Highest education level: Not on file  Occupational History  . Not on file  Social Needs  . Financial resource strain: Not on file  . Food insecurity:    Worry: Not on file    Inability: Not on file  . Transportation needs:    Medical: Not on file    Non-medical: Not on file  Tobacco Use  . Smoking status: Never Smoker  . Smokeless tobacco: Never Used  Substance and Sexual Activity  . Alcohol use: No    Alcohol/week: 0.0 standard drinks  . Drug use: No  . Sexual activity: Not on file  Lifestyle  . Physical activity:    Days per week: Not on file    Minutes per session: Not on file  . Stress: Not on file  Relationships  . Social connections:    Talks on phone: Not on file    Gets together: Not on file    Attends religious service: Not on file   Active member of club or organization: Not on file    Attends meetings of clubs or organizations: Not on file    Relationship status: Not on file  . Intimate partner violence:    Fear of current or ex partner: Not on file    Emotionally abused: Not on file    Physically abused: Not on file    Forced sexual activity: Not on file  Other Topics Concern  . Not on file  Social History Narrative   Married - wife is NP no children   Never Smoked   Alcohol use-no    Drug use-no    Regular exercise-yes     Occupation:  Works as a Magazine features editor for the state       No past surgical history on file.  Family History  Problem Relation Age of Onset  . Diabetes Mother   . Hypertension Mother   . Stroke Maternal Grandfather   . Coronary artery disease Maternal Uncle   . Other Neg Hx        no FH of colon cancer    No Known Allergies  Current Outpatient Medications on File Prior to Visit  Medication Sig Dispense Refill  . aspirin EC 81 MG tablet Take 81 mg by mouth daily.    Marland Kitchen atorvastatin (LIPITOR) 20 MG tablet TAKE 1 TABLET BY MOUTH EVERY DAY 90 tablet 1  . hydrochlorothiazide (HYDRODIURIL) 25 MG tablet Take 1 tablet (25 mg total) by mouth daily. 90 tablet 1  . naproxen (NAPROSYN) 500 MG tablet Take 1 tablet (500 mg total) by mouth 2 (two) times daily with a meal. 30 tablet 0  . Omega-3 Fatty Acids (FISH OIL) 1000 MG CAPS Take 2,000 mg by mouth daily.     . potassium chloride SA (KLOR-CON M20) 20 MEQ tablet Take 1 tablet by mouth daily. 90 tablet 1   No current facility-administered medications on file prior to visit.     BP 132/89 (BP Location: Left Arm, Patient Position: Sitting, Cuff Size: Small)   Pulse 64   Temp 98.6 F (37 C) (Oral)   Resp 16   Ht 5\' 11"  (1.803 m)   Wt 216 lb (98 kg)   SpO2 100%   BMI 30.13 kg/m       Objective:   Physical Exam Constitutional:      General: He is not in acute distress.    Appearance: He is well-developed.  HENT:     Head:  Normocephalic and atraumatic.  Cardiovascular:     Rate and Rhythm: Normal rate and regular rhythm.     Heart sounds: No murmur.  Pulmonary:     Effort: Pulmonary effort is normal. No respiratory distress.     Breath sounds: Normal breath sounds. No wheezing or rales.  Skin:    General: Skin is warm and dry.  Neurological:     Mental Status: He is alert and oriented to person, place, and time.  Psychiatric:        Behavior: Behavior normal.        Thought Content: Thought content normal.           Assessment & Plan:  HTN- bp is stable on hctz, continue same. Obtain follow up cmet.  Hyperglycemia- reinforced dietary modification, obtain follow up A1C.  Hyperlipidemia- tolerating statin. Obtain follow up lipid panel.

## 2019-05-20 IMAGING — CT CT FOOT*L* W/O CM
4 of 5 series · 10 of 20 positions shown, 11 images · non-contrast
Comparison: Left foot x-rays dated July 31, 2017.

CLINICAL DATA: Left heel pain.  Evaluate for calcaneus fracture.

EXAM:
CT OF THE LEFT FOOT WITHOUT CONTRAST
TECHNIQUE: Multidetector CT imaging of the left foot was performed according to
the standard protocol. Multiplanar CT image reconstructions were
also generated.

[Series 4: soft tissue lower extremity · axial · 0.59mm/px · z∈[-228,-126]mm · 4 of 86 slices shown]
[im 18/86  soft-tissue]
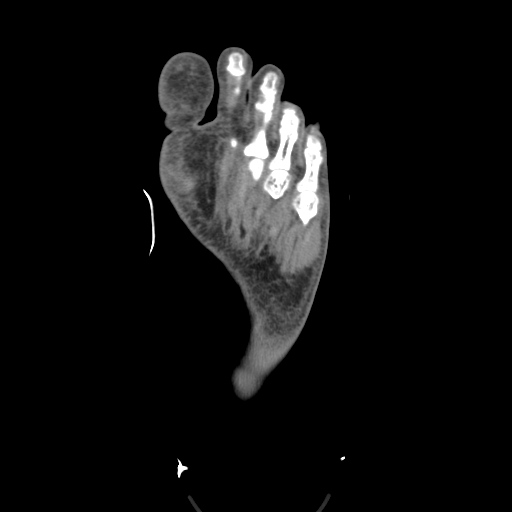
[im 35/86  soft-tissue]
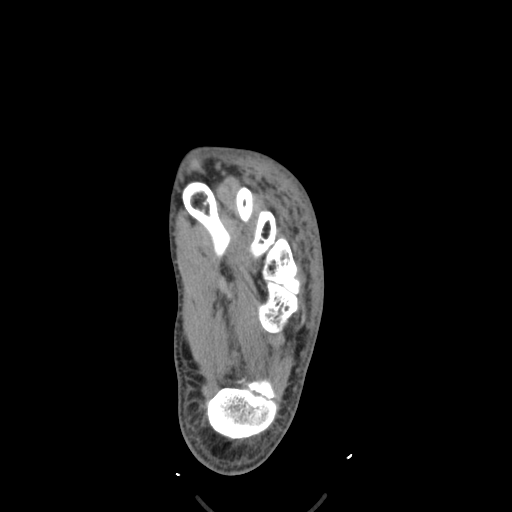
[im 52/86  soft-tissue]
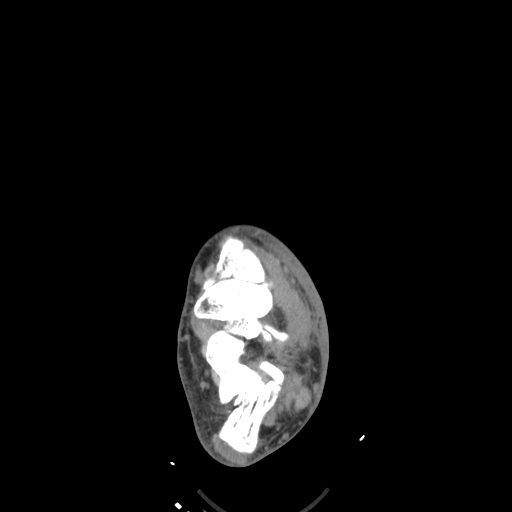
[im 69/86  soft-tissue]
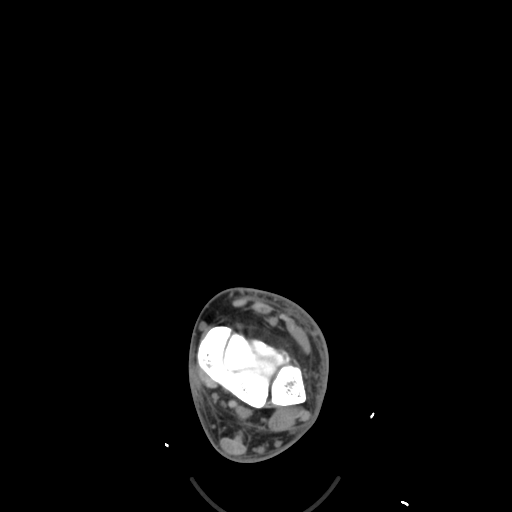

[Series 602: cor bone long axis · axial · 0.59mm/px · z∈[-241,-200]mm · 2 of 63 slices shown, 3 images]
[im 21/63  soft-tissue]
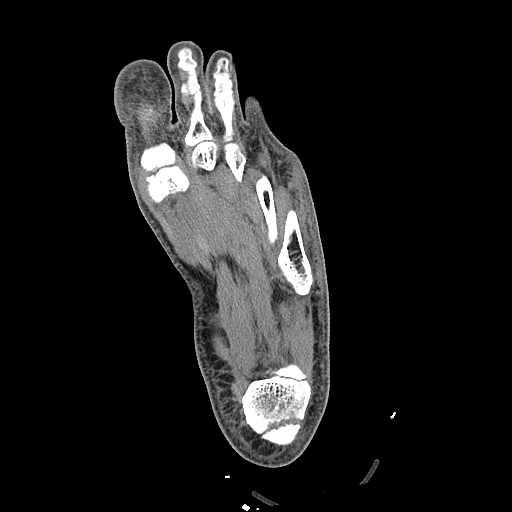
[im 21/63  bone]
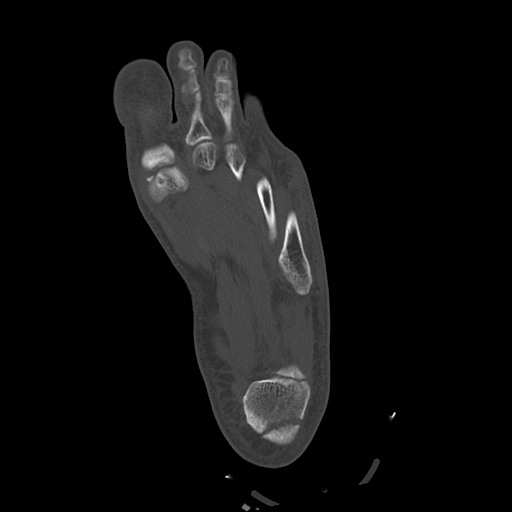
[im 42/63  bone]
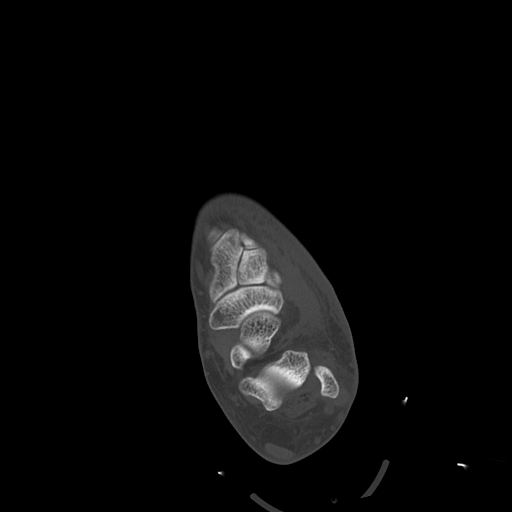

[Series 603: bone short axis · coronal · 0.59mm/px · 2 of 80 slices shown]
[im 10/80  bone]
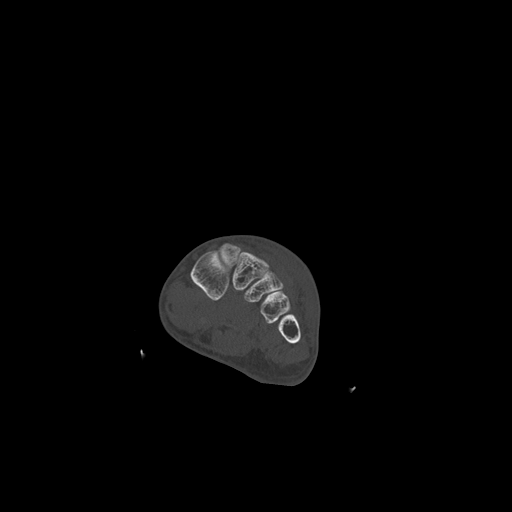
[im 45/80  bone]
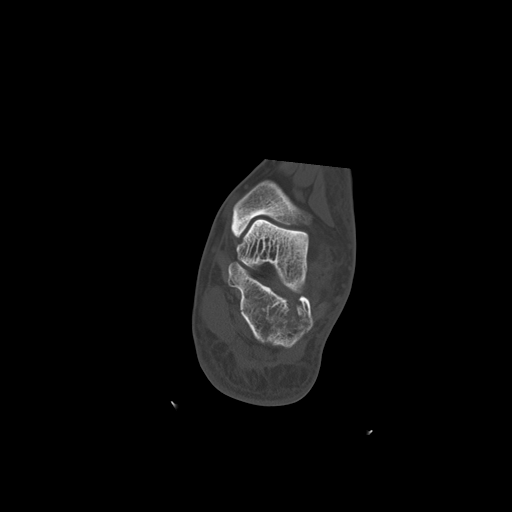

[Series 605: cor soft long axis · axial · 0.59mm/px · z∈[-285,-244]mm · 2 of 69 slices shown]
[im 23/69  soft-tissue]
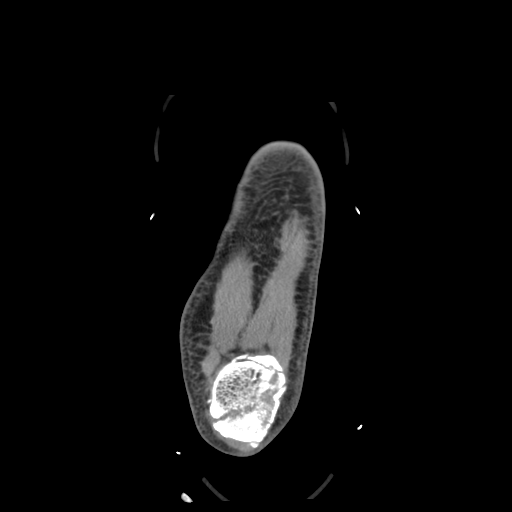
[im 46/69  soft-tissue]
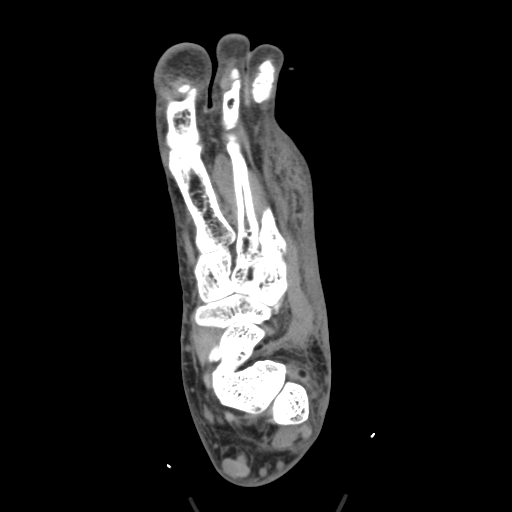

[10 of 20 positions shown; findings below may reference images not displayed]

FINDINGS: Bones/Joint/Cartilage

There is a highly comminuted, impacted, minimally displaced fracture
of the calcaneus. No additional fracture seen. The talar dome and
tibial plafond are intact. Lisfranc alignment is preserved.

Small subtalar joint effusion.  No tibiotalar joint effusion.

Diffuse osteopenia, greater than expected for age.

Ligaments

Suboptimally assessed by CT.

Muscles and Tendons

The peroneal tendons are subluxed laterally. The flexor, extensor,
and Achilles tendons are grossly unremarkable.

Soft tissues

Prominent soft tissues line surrounding the calcaneus an along the
dorsum of the foot.
IMPRESSION: 1. Highly comminuted, impacted fracture of the calcaneus.
2. Lateral subluxation of the peroneal tendons.

## 2019-07-30 ENCOUNTER — Other Ambulatory Visit: Payer: Self-pay | Admitting: Family

## 2019-10-10 ENCOUNTER — Other Ambulatory Visit: Payer: Self-pay | Admitting: Family

## 2020-01-11 ENCOUNTER — Other Ambulatory Visit: Payer: Self-pay | Admitting: Family

## 2020-01-11 ENCOUNTER — Telehealth: Payer: Self-pay | Admitting: Family

## 2020-01-11 MED ORDER — HYDROCHLOROTHIAZIDE 25 MG PO TABS: 25.0000 mg | ORAL_TABLET | Freq: Every day | ORAL | 0 refills | Status: DC

## 2020-01-11 NOTE — Telephone Encounter (Signed)
Done

## 2020-01-11 NOTE — Telephone Encounter (Signed)
Please contact pt to do a virtual visit.  She is past due for follow up. You will want to call the son since she does not speak [place]

## 2020-01-17 ENCOUNTER — Encounter: Payer: Self-pay | Admitting: Family

## 2020-01-17 ENCOUNTER — Other Ambulatory Visit: Payer: Self-pay

## 2020-01-17 ENCOUNTER — Ambulatory Visit (INDEPENDENT_AMBULATORY_CARE_PROVIDER_SITE_OTHER): Payer: BC Managed Care – PPO | Admitting: Family

## 2020-01-17 VITALS — BP 127/85 | HR 84 | Ht 70.0 in | Wt 210.0 lb

## 2020-01-17 DIAGNOSIS — K760 Fatty (change of) liver, not elsewhere classified: Secondary | ICD-10-CM

## 2020-01-17 DIAGNOSIS — I10 Essential (primary) hypertension: Secondary | ICD-10-CM | POA: Diagnosis not present

## 2020-01-17 DIAGNOSIS — E785 Hyperlipidemia, unspecified: Secondary | ICD-10-CM

## 2020-01-17 DIAGNOSIS — R739 Hyperglycemia, unspecified: Secondary | ICD-10-CM | POA: Diagnosis not present

## 2020-01-17 MED ORDER — ATORVASTATIN CALCIUM 20 MG PO TABS: 20.0000 mg | ORAL_TABLET | Freq: Every day | ORAL | 1 refills | Status: DC

## 2020-01-17 MED ORDER — POTASSIUM CHLORIDE CRYS ER 20 MEQ PO TBCR: EXTENDED_RELEASE_TABLET | ORAL | 1 refills | Status: DC

## 2020-01-17 MED ORDER — HYDROCHLOROTHIAZIDE 25 MG PO TABS: 25.0000 mg | ORAL_TABLET | Freq: Every day | ORAL | 1 refills | Status: DC

## 2020-01-17 NOTE — Progress Notes (Signed)
Virtual Visit via Video Note  I connected with [name] [name] on 01/17/20 at 10:20 AM EST by a video enabled telemedicine application and verified that I am speaking with the correct person using two identifiers.  Location: Patient: home Provider: home   I discussed the limitations of evaluation and management by telemedicine and the availability of in person appointments. The patient expressed understanding and agreed to proceed.  History of Present Illness:  Patient is a 44 yr old male who presents today for follow up.  HTN- maintained on HCTZ and Kdur. Reports good med compliance.  Had a flu shot 9/20 BP Readings from Last 3 Encounters:  01/17/20 127/85  02/05/19 132/89  08/07/18 126/85   Hyperlipidemia- Maintained on atorvastatin/fish oil.  Denies myalgia.  Lab Results  Component Value Date   CHOL 123 02/05/2019   HDL 30.40 (L) 02/05/2019   LDLCALC 70 02/05/2019   LDLDIRECT 152.4 07/10/2010   TRIG 110.0 02/05/2019   CHOLHDL 4 02/05/2019   Hyperglycemia- not on medication Lab Results  Component Value Date   HGBA1C 5.9 02/05/2019   Past Medical History:  Diagnosis Date  . Abnormal LFTs (liver function tests)    secondary to lovastatin and fatty liver  . Calcaneal fracture 07/31/2017   left  . Fatty liver    history of fatty liver  . Hyperlipidemia   . Hypertension      Social History   Socioeconomic History  . Marital status: Married    Spouse name: Not on file  . Number of children: Not on file  . Years of education: Not on file  . Highest education level: Not on file  Occupational History  . Not on file  Tobacco Use  . Smoking status: Never Smoker  . Smokeless tobacco: Never Used  Substance and Sexual Activity  . Alcohol use: No    Alcohol/week: 0.0 standard drinks  . Drug use: No  . Sexual activity: Not on file  Other Topics Concern  . 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 Difficulty of Paying Living Expenses: Not on file  Food Insecurity:   . Worried About Charity fundraiser in the Last Year: Not on file  . Ran Out of Food in the Last Year: Not on file  Transportation Needs:   . Lack of Transportation (Medical): Not on file  . Lack of Transportation (Non-Medical): Not on file  Physical Activity:   . Days of Exercise per Week: Not on file  . Minutes of Exercise per Session: Not on file  Stress:   . Feeling of Stress : Not on file  Social Connections:   . Frequency of Communication with Friends and Family: Not on file  . Frequency of Social Gatherings with Friends and Family: Not on file  . Attends Religious Services: Not on file  . Active Member of Clubs or Organizations: Not on file  . Attends Archivist Meetings: Not on file  . Marital Status: Not on file  Intimate Partner Violence:   . Fear of Current or Ex-Partner: Not on file  . Emotionally Abused: Not on file  . Physically Abused: Not on file  .  Sexually Abused: Not on file    No past surgical history on file.  Family History  Problem Relation Age of Onset  . Diabetes Mother   . Hypertension Mother   . Stroke Maternal Grandfather   . Coronary artery disease Maternal Uncle   . Other Neg Hx        no FH of colon cancer    No Known Allergies  Current Outpatient Medications on File Prior to Visit  Medication Sig Dispense Refill  . aspirin EC 81 MG tablet Take 81 mg by mouth daily.    . naproxen (NAPROSYN) 500 MG tablet Take 1 tablet (500 mg total) by mouth 2 (two) times daily with a meal. 30 tablet 0  . Omega-3 Fatty Acids (FISH OIL) 1000 MG CAPS Take 2,000 mg by mouth daily.      No current facility-administered medications on file prior to visit.    BP 127/85 (BP Location: Left Arm, Patient Position: Sitting)    Pulse 84   Ht 5\' 10"  (1.778 m)   Wt 210 lb (95.3 kg)   BMI 30.13 kg/m      Observations/Objective:   Gen: Awake, alert, no acute distress Resp: Breathing is even and non-labored Psych: calm/pleasant demeanor Neuro: Alert and Oriented x 3, + facial symmetry, speech is clear.   Assessment and Plan:  HTN- bp is stable on hctz. Continue same. He will schedule a lab appointment. Plan to check cmet  Continue Kdur.  Hyperlipidemia- obtain follow up lipid panel.  Tolerating statin/fish oil, continue same.  Hyperglycemia- obtain follow up A1C.   Fatty liver disease- obtain follow up lipid panel.  Follow Up Instructions:    I discussed the assessment and treatment plan with the patient. The patient was provided an opportunity to ask questions and all were answered. The patient agreed with the plan and demonstrated an understanding of the instructions.   The patient was advised to call back or seek an in-person evaluation if the symptoms worsen or if the condition fails to improve as anticipated.  , NP

## 2020-06-19 ENCOUNTER — Ambulatory Visit: Payer: BC Managed Care – PPO | Admitting: Internal Medicine

## 2020-06-19 ENCOUNTER — Encounter: Payer: Self-pay | Admitting: Internal Medicine

## 2020-06-19 ENCOUNTER — Other Ambulatory Visit: Payer: Self-pay

## 2020-06-19 VITALS — BP 144/88 | HR 68 | Temp 96.9°F | Resp 16 | Ht 70.0 in | Wt 213.0 lb

## 2020-06-19 DIAGNOSIS — R739 Hyperglycemia, unspecified: Secondary | ICD-10-CM | POA: Diagnosis not present

## 2020-06-19 DIAGNOSIS — I10 Essential (primary) hypertension: Secondary | ICD-10-CM

## 2020-06-19 DIAGNOSIS — M109 Gout, unspecified: Secondary | ICD-10-CM

## 2020-06-19 LAB — COMPREHENSIVE METABOLIC PANEL
ALT: 54 U/L — ABNORMAL HIGH (ref 0–53)
AST: 26 U/L (ref 0–37)
Albumin: 4.9 g/dL (ref 3.5–5.2)
Alkaline Phosphatase: 54 U/L (ref 39–117)
BUN: 12 mg/dL (ref 6–23)
CO2: 30 mEq/L (ref 19–32)
Calcium: 9.9 mg/dL (ref 8.4–10.5)
Chloride: 100 mEq/L (ref 96–112)
Creatinine, Ser: 0.94 mg/dL (ref 0.40–1.50)
GFR: 86.71 mL/min (ref >60.00)
Glucose, Bld: 91 mg/dL (ref 70–99)
Potassium: 3.8 mEq/L (ref 3.5–5.1)
Sodium: 139 mEq/L (ref 135–145)
Total Bilirubin: 1 mg/dL (ref 0.2–1.2)
Total Protein: 7.6 g/dL (ref 6.0–8.3)

## 2020-06-19 LAB — CBC WITH DIFFERENTIAL/PLATELET
Basophils Absolute: 0.1 10*3/uL (ref 0.0–0.1)
Basophils Relative: 0.9 % (ref 0.0–3.0)
Eosinophils Absolute: 0.2 10*3/uL (ref 0.0–0.7)
Eosinophils Relative: 2.2 % (ref 0.0–5.0)
HCT: 46.9 % (ref 39.0–52.0)
Hemoglobin: 16.2 g/dL (ref 13.0–17.0)
Lymphocytes Relative: 32.8 % (ref 12.0–46.0)
Lymphs Abs: 2.6 10*3/uL (ref 0.7–4.0)
MCHC: 34.5 g/dL (ref 30.0–36.0)
MCV: 88.7 fl (ref 78.0–100.0)
Monocytes Absolute: 0.6 10*3/uL (ref 0.1–1.0)
Monocytes Relative: 7.8 % (ref 3.0–12.0)
Neutro Abs: 4.4 10*3/uL (ref 1.4–7.7)
Neutrophils Relative %: 56.3 % (ref 43.0–77.0)
Platelets: 259 10*3/uL (ref 150.0–400.0)
RBC: 5.29 Mil/uL (ref 4.22–5.81)
RDW: 13.1 % (ref 11.5–15.5)
WBC: 7.9 10*3/uL (ref 4.0–10.5)

## 2020-06-19 LAB — HEMOGLOBIN A1C: Hgb A1c MFr Bld: 5.8 % (ref 4.6–6.5)

## 2020-06-19 LAB — URIC ACID: Uric Acid, Serum: 8.3 mg/dL — ABNORMAL HIGH (ref 4.0–7.8)

## 2020-06-19 MED ORDER — PREDNISONE 10 MG PO TABS
ORAL_TABLET | ORAL | 0 refills | Status: DC
Start: 2020-06-19 — End: 2020-07-21

## 2020-06-19 MED ORDER — COLCHICINE 0.6 MG PO CAPS: 1.0000 | ORAL_CAPSULE | Freq: Two times a day (BID) | ORAL | 0 refills | Status: DC | PRN

## 2020-06-19 NOTE — Progress Notes (Signed)
Pre visit review using our clinic review tool, if applicable. No additional management support is needed unless otherwise documented below in the visit note. 

## 2020-06-19 NOTE — Patient Instructions (Addendum)
You can stop naproxen  Take prednisone as prescribed for few days  Also start colchicine 1 tablet twice a day as needed for gout.  Please watch your diet to prevent gout episodes  Hold atorvastatin for few days, they may interact with colchicine  Check the  blood pressure weekly BP GOAL is between 110/65 and  135/85. If it is consistently higher or lower, let me know    GO TO THE LAB : Get the blood work     GO TO THE FRONT DESK, PLEASE SCHEDULE YOUR APPOINTMENTS Come back for a check up w/ your primary provider in 4 to 6 weeks

## 2020-06-19 NOTE — Progress Notes (Signed)
Subjective:    Patient ID: [name] [name], male    DOB: 02/18/1976, 44 y.o.   MRN: [id number]  DOS:  06/19/2020 Type of visit - description: Acute Symptoms started 5 days ago, developed pain, redness and tenderness at the right great toe. Has taken naproxen 2 tablets twice a day.  On further questioning, he was told he has gout in 2019 but since then he has been asymptomatic.  Denies other concerns  BP Readings from Last 3 Encounters:  06/19/20 (!) 160/108  01/17/20 127/85  02/05/19 132/89     Review of Systems See above   Past Medical History:  Diagnosis Date   Abnormal LFTs (liver function tests)    secondary to lovastatin and fatty liver   Calcaneal fracture 07/31/2017   left   Fatty liver    history of fatty liver   Hyperlipidemia    Hypertension     No past surgical history on file.  Allergies as of 06/19/2020   No Known Allergies     Medication List       Accurate as of June 19, 2020 10:24 AM. If you have any questions, ask your nurse or doctor.        aspirin EC 81 MG tablet Take 81 mg by mouth daily.   atorvastatin 20 MG tablet Commonly known as: LIPITOR Take 1 tablet (20 mg total) by mouth daily.   Fish Oil 1000 MG Caps Take 2,000 mg by mouth daily.   hydrochlorothiazide 25 MG tablet Commonly known as: HYDRODIURIL Take 1 tablet (25 mg total) by mouth daily.   naproxen 500 MG tablet Commonly known as: Naprosyn Take 1 tablet (500 mg total) by mouth 2 (two) times daily with a meal.   potassium chloride SA 20 MEQ tablet Commonly known as: Klor-Con M20 Take 1 tablet by mouth daily.          Objective:   Physical Exam Musculoskeletal:       Feet:    BP (!) 160/108 (BP Location: Left Arm, Patient Position: Sitting, Cuff Size: Normal)    Pulse 68    Temp (!) 96.9 F (36.1 C) (Temporal)    Resp 16    Ht 5\' 10"  (1.778 m)    Wt 213 lb (96.6 kg)    SpO2 96%    BMI 30.56 kg/m  General:   Well developed, NAD, BMI noted. HEENT:    Normocephalic . Face symmetric, atraumatic Lungs:  CTA B Normal respiratory effort, no intercostal retractions, no accessory muscle use. Heart: RRR,  no murmur.  Lower extremities: Good pedal pulses, see graphic Skin: Not pale. Not jaundice Neurologic:  alert & oriented X3.  Speech normal, gait appropriate for age and unassisted Psych--  Cognition and judgment appear intact.  Cooperative with normal attention span and concentration.  Behavior appropriate. No anxious or depressed appearing.      Assessment      44 year old male,PMH includes high cholesterol, HTN, hyperglycemia (A1c 5.9 06-2019) presents with  Podagra, gout: SX C/W podagra.  Explained the patient that he likely has gout.  Admits that recently he has been eating more seafood than before. He is on chronic HCTZ. Plan: CMP, CBC, uric acid Stop NSAIDs, prednisone prescribed, colchicine as needed. Allopurinol?  Having very infrequent attacks, that is a consideration in the future. Also he is on long-term diuretics with typically good control of BP.  No change Diet discussed, information provided HTN: On hydrochlorothiazide, BP elevated upon arrival, recheck : 148/88. Hyperglycemia:  We are prescribing prednisone, checking A1c. RTC to see PCP in few weeks  This visit occurred during the SARS-CoV-2 public health emergency.  Safety protocols were in place, including screening questions prior to the visit, additional usage of staff PPE, and extensive cleaning of exam room while observing appropriate contact time as indicated for disinfecting solutions.

## 2020-07-21 ENCOUNTER — Encounter: Payer: Self-pay | Admitting: Family

## 2020-07-21 ENCOUNTER — Other Ambulatory Visit: Payer: Self-pay

## 2020-07-21 ENCOUNTER — Ambulatory Visit: Payer: BC Managed Care – PPO | Admitting: Family

## 2020-07-21 VITALS — BP 118/70 | HR 72 | Resp 16 | Ht 70.0 in | Wt 214.0 lb

## 2020-07-21 DIAGNOSIS — E785 Hyperlipidemia, unspecified: Secondary | ICD-10-CM

## 2020-07-21 DIAGNOSIS — I10 Essential (primary) hypertension: Secondary | ICD-10-CM | POA: Diagnosis not present

## 2020-07-21 DIAGNOSIS — Z23 Encounter for immunization: Secondary | ICD-10-CM | POA: Diagnosis not present

## 2020-07-21 DIAGNOSIS — E1169 Type 2 diabetes mellitus with other specified complication: Secondary | ICD-10-CM | POA: Diagnosis not present

## 2020-07-21 DIAGNOSIS — M109 Gout, unspecified: Secondary | ICD-10-CM | POA: Diagnosis not present

## 2020-07-21 DIAGNOSIS — Z0000 Encounter for general adult medical examination without abnormal findings: Secondary | ICD-10-CM

## 2020-07-21 LAB — CBC WITH DIFFERENTIAL/PLATELET
Basophils Absolute: 0 10*3/uL (ref 0.0–0.1)
Basophils Relative: 0.4 % (ref 0.0–3.0)
Eosinophils Absolute: 0.1 10*3/uL (ref 0.0–0.7)
Eosinophils Relative: 2.2 % (ref 0.0–5.0)
HCT: 44.9 % (ref 39.0–52.0)
Hemoglobin: 15.6 g/dL (ref 13.0–17.0)
Lymphocytes Relative: 38.2 % (ref 12.0–46.0)
Lymphs Abs: 2.5 10*3/uL (ref 0.7–4.0)
MCHC: 34.7 g/dL (ref 30.0–36.0)
MCV: 88.1 fl (ref 78.0–100.0)
Monocytes Absolute: 0.5 10*3/uL (ref 0.1–1.0)
Monocytes Relative: 7.2 % (ref 3.0–12.0)
Neutro Abs: 3.4 10*3/uL (ref 1.4–7.7)
Neutrophils Relative %: 52 % (ref 43.0–77.0)
Platelets: 215 10*3/uL (ref 150.0–400.0)
RBC: 5.1 Mil/uL (ref 4.22–5.81)
RDW: 13.3 % (ref 11.5–15.5)
WBC: 6.6 10*3/uL (ref 4.0–10.5)

## 2020-07-21 LAB — BASIC METABOLIC PANEL
BUN: 11 mg/dL (ref 6–23)
CO2: 32 mEq/L (ref 19–32)
Calcium: 9.9 mg/dL (ref 8.4–10.5)
Chloride: 101 mEq/L (ref 96–112)
Creatinine, Ser: 0.97 mg/dL (ref 0.40–1.50)
GFR: 83.59 mL/min (ref >60.00)
Glucose, Bld: 115 mg/dL — ABNORMAL HIGH (ref 70–99)
Potassium: 3.8 mEq/L (ref 3.5–5.1)
Sodium: 139 mEq/L (ref 135–145)

## 2020-07-21 LAB — LIPID PANEL
Cholesterol: 142 mg/dL (ref 0–200)
HDL: 34.8 mg/dL — ABNORMAL LOW (ref >39.00)
LDL Cholesterol: 77 mg/dL (ref 0–99)
NonHDL: 107.14
Total CHOL/HDL Ratio: 4
Triglycerides: 150 mg/dL — ABNORMAL HIGH (ref 0.0–149.0)
VLDL: 30 mg/dL (ref 0.0–40.0)

## 2020-07-21 LAB — HEPATIC FUNCTION PANEL
ALT: 57 U/L — ABNORMAL HIGH (ref 0–53)
AST: 28 U/L (ref 0–37)
Albumin: 4.6 g/dL (ref 3.5–5.2)
Alkaline Phosphatase: 48 U/L (ref 39–117)
Bilirubin, Direct: 0.2 mg/dL (ref 0.0–0.3)
Total Bilirubin: 1.1 mg/dL (ref 0.2–1.2)
Total Protein: 7.1 g/dL (ref 6.0–8.3)

## 2020-07-21 LAB — URIC ACID: Uric Acid, Serum: 8.7 mg/dL — ABNORMAL HIGH (ref 4.0–7.8)

## 2020-07-21 LAB — TSH: TSH: 1.1 u[IU]/mL (ref 0.35–4.50)

## 2020-07-21 MED ORDER — AMLODIPINE BESYLATE 2.5 MG PO TABS
2.5000 mg | ORAL_TABLET | Freq: Every day | ORAL | 3 refills | Status: DC
Start: 2020-07-21 — End: 2020-07-25

## 2020-07-21 NOTE — Progress Notes (Signed)
Subjective:    Patient ID: [name] [name], male    DOB: [birth year], 44 y.o.   MRN: [id number]  HPI   Patient is a 44 yr old male who presents today for annual physical.  Immunizations: had pfizer series, tetanus due.   Diet: fair diet Wt Readings from Last 3 Encounters:  07/21/20 (!) 214 lb (97.1 kg)  06/19/20 213 lb (96.6 kg)  01/17/20 210 lb (95.3 kg)  Exercise: walks, used to play tennis Vision:  Due Dental: Due   HTN- maintained on hctz.   BP Readings from Last 3 Encounters:  07/21/20 118/70  06/19/20 (!) 144/88  01/17/20 127/85   Hyperlipidemia- maintained on atorvastatin.  Lab Results  Component Value Date   CHOL 123 02/05/2019   HDL 30.40 (L) 02/05/2019   LDLCALC 70 02/05/2019   LDLDIRECT 152.4 07/10/2010   TRIG 110.0 02/05/2019   CHOLHDL 4 02/05/2019   He reports that he has had 2 gout flares recently.   Review of Systems  Constitutional: Negative for unexpected weight change.  HENT: Negative for hearing loss and rhinorrhea.   Eyes: Negative for visual disturbance.  Respiratory: Negative for cough and shortness of breath.   Cardiovascular: Negative for chest pain.  Gastrointestinal: Negative for blood in stool, constipation and diarrhea.  Genitourinary: Negative for hematuria.  Musculoskeletal: Negative for arthralgias and myalgias.  Skin: Negative for rash.  Neurological: Negative for headaches.  Hematological: Negative for adenopathy.  Psychiatric/Behavioral:       Denies depression/anxiety   Past Medical History:  Diagnosis Date  . Abnormal LFTs (liver function tests)    secondary to lovastatin and fatty liver  . Calcaneal fracture 07/31/2017   left  . Fatty liver    history of fatty liver  . Hyperlipidemia   . Hypertension      Social History   Socioeconomic History  . Marital status: Married    Spouse name: Not on file  . Number of children: Not on file  . Years of education: Not on file  . Highest education level: Not on file    Occupational History  . Not on file  Tobacco Use  . Smoking status: Never Smoker  . Smokeless tobacco: Never Used  Substance and Sexual Activity  . Alcohol use: No    Alcohol/week: 0.0 standard drinks  . Drug use: No  . Sexual activity: Not on file  Other Topics Concern  . Not on file  Social History Narrative   Married - wife is NP no children   Never Smoked   Alcohol use-no    Drug use-no    Regular exercise-yes     Occupation:  Works as a Magazine features editor for the state      Social Determinants of Corporate investment banker Strain:   . Difficulty of Paying Living Expenses:   Food Insecurity:   . Worried About Programme researcher, broadcasting/film/video in the Last Year:   . Barista in the Last Year:   Transportation Needs:   . Freight forwarder (Medical):   Marland Kitchen Lack of Transportation (Non-Medical):   Physical Activity:   . Days of Exercise per Week:   . Minutes of Exercise per Session:   Stress:   . Feeling of Stress :   Social Connections:   . Frequency of Communication with Friends and Family:   . Frequency of Social Gatherings with Friends and Family:   . Attends Religious Services:   . Active Member of Clubs or  Organizations:   . Attends Banker Meetings:   Marland Kitchen Marital Status:   Intimate Partner Violence:   . Fear of Current or Ex-Partner:   . Emotionally Abused:   Marland Kitchen Physically Abused:   . Sexually Abused:     No past surgical history on file.  Family History  Problem Relation Age of Onset  . Diabetes Mother   . Hypertension Mother   . Stroke Maternal Grandfather   . Coronary artery disease Maternal Uncle   . Other Neg Hx        no FH of colon cancer    No Known Allergies  Current Outpatient Medications on File Prior to Visit  Medication Sig Dispense Refill  . aspirin EC 81 MG tablet Take 81 mg by mouth daily.    Marland Kitchen atorvastatin (LIPITOR) 20 MG tablet Take 1 tablet (20 mg total) by mouth daily. 90 tablet 1  . hydrochlorothiazide (HYDRODIURIL) 25 MG  tablet Take 1 tablet (25 mg total) by mouth daily. 90 tablet 1  . Omega-3 Fatty Acids (FISH OIL) 1000 MG CAPS Take 2,000 mg by mouth daily.      No current facility-administered medications on file prior to visit.    BP 118/70 (BP Location: Right Arm, Patient Position: Sitting, Cuff Size: Large)   Pulse 72   Resp 16   Ht 5\' 10"  (1.778 m)   Wt (!) 214 lb (97.1 kg)   SpO2 98%   BMI 30.71 kg/m       Objective:   Physical Exam  Physical Exam  Constitutional: He is oriented to person, place, and time. He appears well-developed and well-nourished. No distress.  HENT:  Head: Normocephalic and atraumatic.  Right Ear: Tympanic membrane and ear canal normal.  Left Ear: Tympanic membrane and ear canal normal.  Mouth/Throat: Not examined- pt wearing mask Eyes: Pupils are equal, round, and reactive to light. No scleral icterus.  Neck: Normal range of motion. No thyromegaly present.  Cardiovascular: Normal rate and regular rhythm.   No murmur heard. Pulmonary/Chest: Effort normal and breath sounds normal. No respiratory distress. He has no wheezes. He has no rales. He exhibits no tenderness.  Abdominal: Soft. Bowel sounds are normal. He exhibits no distension and no mass. There is no tenderness. There is no rebound and no guarding.  Musculoskeletal: He exhibits no edema.  Lymphadenopathy:    He has no cervical adenopathy.  Neurological: He is alert and oriented to person, place, and time. He has normal patellar reflexes. He exhibits normal muscle tone. Coordination normal.  Skin: Skin is warm and dry.  Psychiatric: He has a normal mood and affect. His behavior is normal. Judgment and thought content normal.           Assessment & Plan:   Preventative care- discussed healthy diet, exercise and weight loss. Obtain routine lab work. Tdap today.  HTN- bp is well controlled but due to recent gout flares will d/c hctz and start trial of amlodipine. He will send me his updated readings  via mychart next week.  Hyperlipidemia- obtain follow up lipid panel. Tolerating statin. Continue same.  Gout- check level, discussed low purine diet. He declines daily preventative gout medication.  This visit occurred during the SARS-CoV-2 public health emergency.  Safety protocols were in place, including screening questions prior to the visit, additional usage of staff PPE, and extensive cleaning of exam room while observing appropriate contact time as indicated for disinfecting solutions.  Assessment & Plan:

## 2020-07-21 NOTE — Patient Instructions (Addendum)
Stop hctz.  Start amlodipine 2.5mg  once daily. Check your blood pressure once daily and send me your readings mid week.  Schedule vision/dental at your convenience.    Low-Purine Eating Plan A low-purine eating plan involves making food choices to limit your intake of purine. Purine is a kind of uric acid. Too much uric acid in your blood can cause certain conditions, such as gout and kidney stones. Eating a low-purine diet can help control these conditions. What are tips for following this plan? Reading food labels   Avoid foods with saturated or Trans fat.  Check the ingredient list of grains-based foods, such as bread and cereal, to make sure that they contain whole grains.  Check the ingredient list of sauces or soups to make sure they do not contain meat or fish.  When choosing soft drinks, check the ingredient list to make sure they do not contain high-fructose corn syrup. Shopping  Buy plenty of fresh fruits and vegetables.  Avoid buying canned or fresh fish.  Buy dairy products labeled as low-fat or nonfat.  Avoid buying premade or processed foods. These foods are often high in fat, salt (sodium), and added sugar. Cooking  Use olive oil instead of butter when cooking. Oils like olive oil, canola oil, and sunflower oil contain healthy fats. Meal planning  Learn which foods do or do not affect you. If you find out that a food tends to cause your gout symptoms to flare up, avoid eating that food. You can enjoy foods that do not cause problems. If you have any questions about a food item, talk with your dietitian or health care provider.  Limit foods high in fat, especially saturated fat. Fat makes it harder for your body to get rid of uric acid.  Choose foods that are lower in fat and are lean sources of protein. General guidelines  Limit alcohol intake to no more than 1 drink a day for nonpregnant women and 2 drinks a day for men. One drink equals 12 oz of beer, 5 oz of  wine, or 1 oz of hard liquor. Alcohol can affect the way your body gets rid of uric acid.  Drink plenty of water to keep your urine clear or pale yellow. Fluids can help remove uric acid from your body.  If directed by your health care provider, take a vitamin C supplement.  Work with your health care provider and dietitian to develop a plan to achieve or maintain a healthy weight. Losing weight can help reduce uric acid in your blood. What foods are recommended? The items listed may not be a complete list. Talk with your dietitian about what dietary choices are best for you. Foods low in purines Foods low in purines do not need to be limited. These include:  All fruits.  All low-purine vegetables, pickles, and olives.  Breads, pasta, rice, cornbread, and popcorn. Cake and other baked goods.  All dairy foods.  Eggs, nuts, and nut butters.  Spices and condiments, such as salt, herbs, and vinegar.  Plant oils, butter, and margarine.  Water, sugar-free soft drinks, tea, coffee, and cocoa.  Vegetable-based soups, broths, sauces, and gravies. Foods moderate in purines Foods moderate in purines should be limited to the amounts listed.   cup of asparagus, cauliflower, spinach, mushrooms, or green peas, each day.  2/3 cup uncooked oatmeal, each day.   cup dry wheat bran or wheat germ, each day.  2-3 ounces of meat or poultry, each day.  4-6 ounces  of shellfish, such as crab, lobster, oysters, or shrimp, each day.  1 cup cooked beans, peas, or lentils, each day.  Soup, broths, or bouillon made from meat or fish. Limit these foods as much as possible. What foods are not recommended? The items listed may not be a complete list. Talk with your dietitian about what dietary choices are best for you. Limit your intake of foods high in purines, including:  Beer and other alcohol.  Meat-based gravy or sauce.  Canned or fresh fish, such as: ? Anchovies, sardines, herring, and  tuna. ? Mussels and scallops. ? Codfish, trout, and haddock.  Tomasa Blase.  Organ meats, such as: ? Liver or kidney. ? Tripe. ? Sweetbreads (thymus gland or pancreas).  Wild Education officer, environmental.  Yeast or yeast extract supplements.  Drinks sweetened with high-fructose corn syrup. Summary  Eating a low-purine diet can help control conditions caused by too much uric acid in the body, such as gout or kidney stones.  Choose low-purine foods, limit alcohol, and limit foods high in fat.  You will learn over time which foods do or do not affect you. If you find out that a food tends to cause your gout symptoms to flare up, avoid eating that food. This information is not intended to replace advice given to you by your health care provider. Make sure you discuss any questions you have with your health care provider. Document Revised: 11/28/2017 Document Reviewed: 01/29/2017 Elsevier Patient Education  2020 [employer].

## 2020-07-25 ENCOUNTER — Telehealth: Payer: Self-pay | Admitting: Family

## 2020-07-25 MED ORDER — AMLODIPINE BESYLATE 5 MG PO TABS
5.0000 mg | ORAL_TABLET | Freq: Every day | ORAL | 3 refills | Status: DC
Start: 2020-07-25 — End: 2020-10-23

## 2020-07-25 NOTE — Telephone Encounter (Signed)
Wife states bp 150/90's on amlodipine 2.5. Wife increased to 5mg  and bp now 120's/80's. Rx sent for 5mg  tabs at her request. Advised her to have pt schedule a 1 month follow up visit with .

## 2020-07-27 ENCOUNTER — Encounter: Payer: Self-pay | Admitting: Family

## 2020-09-02 ENCOUNTER — Other Ambulatory Visit: Payer: Self-pay | Admitting: Family

## 2020-10-23 ENCOUNTER — Other Ambulatory Visit: Payer: Self-pay | Admitting: *Deleted

## 2020-10-23 MED ORDER — AMLODIPINE BESYLATE 5 MG PO TABS: 5.0000 mg | ORAL_TABLET | Freq: Every day | ORAL | 3 refills | Status: DC

## 2020-11-20 ENCOUNTER — Other Ambulatory Visit: Payer: Self-pay

## 2020-11-20 ENCOUNTER — Encounter: Payer: Self-pay | Admitting: Family

## 2020-11-20 ENCOUNTER — Ambulatory Visit: Payer: BC Managed Care – PPO | Admitting: Family

## 2020-11-20 VITALS — BP 148/89 | HR 70 | Temp 97.8°F | Resp 16 | Ht 71.0 in | Wt 211.0 lb

## 2020-11-20 DIAGNOSIS — M25532 Pain in left wrist: Secondary | ICD-10-CM | POA: Diagnosis not present

## 2020-11-20 DIAGNOSIS — E785 Hyperlipidemia, unspecified: Secondary | ICD-10-CM

## 2020-11-20 DIAGNOSIS — I10 Essential (primary) hypertension: Secondary | ICD-10-CM

## 2020-11-20 DIAGNOSIS — R739 Hyperglycemia, unspecified: Secondary | ICD-10-CM | POA: Diagnosis not present

## 2020-11-20 LAB — HEMOGLOBIN A1C: Hgb A1c MFr Bld: 5.7 % (ref 4.6–6.5)

## 2020-11-20 LAB — BASIC METABOLIC PANEL
BUN: 9 mg/dL (ref 6–23)
CO2: 29 mEq/L (ref 19–32)
Calcium: 9.9 mg/dL (ref 8.4–10.5)
Chloride: 105 mEq/L (ref 96–112)
Creatinine, Ser: 1.03 mg/dL (ref 0.40–1.50)
GFR: 87.66 mL/min (ref >60.00)
Glucose, Bld: 108 mg/dL — ABNORMAL HIGH (ref 70–99)
Potassium: 4.2 mEq/L (ref 3.5–5.1)
Sodium: 143 mEq/L (ref 135–145)

## 2020-11-20 MED ORDER — ATORVASTATIN CALCIUM 20 MG PO TABS: 20.0000 mg | ORAL_TABLET | Freq: Every day | ORAL | 1 refills | Status: DC

## 2020-11-20 MED ORDER — AMLODIPINE BESYLATE 5 MG PO TABS: 5.0000 mg | ORAL_TABLET | Freq: Every day | ORAL | 1 refills | Status: DC

## 2020-11-20 NOTE — Patient Instructions (Addendum)
Please check your blood pressure once daily for 3 days and then send me your readings via mychart.   Please also send me the date of your 3rd Pfizer vaccine.

## 2020-11-20 NOTE — Progress Notes (Signed)
Subjective:    Patient ID: [name] [name], male    DOB: 08-16-1976, 44 y.o.   MRN: [id number]  HPI  Patient is a 44 yr male who presents today for follow up.  HTN- maintained on amlodipine 5mg .  Reports bp yesterday "looked fine."  He states that his blood pressure tends to run higher in the office.  BP Readings from Last 3 Encounters:  11/20/20 (!) 148/89  07/21/20 118/70  06/19/20 (!) 144/88   Hyperlipidemia- maintained on atorvastatin 20mg .   Lab Results  Component Value Date   CHOL 142 07/21/2020   HDL 34.80 (L) 07/21/2020   LDLCALC 77 07/21/2020   LDLDIRECT 152.4 07/10/2010   TRIG 150.0 (H) 07/21/2020   CHOLHDL 4 07/21/2020   Left wrist pain-reports intermittent mild left wrist pain.  He attributes this to working too much on the computer.     Review of Systems    see HPI  Past Medical History:  Diagnosis Date   Abnormal LFTs (liver function tests)    secondary to lovastatin and fatty liver   Calcaneal fracture 07/31/2017   left   Fatty liver    history of fatty liver   Hyperlipidemia    Hypertension      Social History   Socioeconomic History   Marital status: Married    Spouse name: Not on file   Number of children: Not on file   Years of education: Not on file   Highest education level: Not on file  Occupational History   Not on file  Tobacco Use   Smoking status: Never Smoker   Smokeless tobacco: Never Used  Substance and Sexual Activity   Alcohol use: No    Alcohol/week: 0.0 standard drinks   Drug use: No   Sexual activity: Not on file  Other Topics Concern   Not on file  Social History Narrative   Married - wife is NP no children   Never Smoked   Alcohol use-no    Drug use-no    Regular exercise-yes     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      Social Determinants of 09/30/2017 Strain:    Difficulty of Paying Living Expenses: Not on file  Food Insecurity:    Worried About Magazine features editor in the Last Year: Not on file   Corporate investment banker of Food in the Last Year: Not on file  Transportation Needs:    Community education officer (Medical): Not on file   Lack of Transportation (Non-Medical): Not on file  Physical Activity:    Days of Exercise per Week: Not on file   Minutes of Exercise per Session: Not on file  Stress:    Feeling of Stress : Not on file  Social Connections:    Frequency of Communication with Friends and Family: Not on file   Frequency of Social Gatherings with Friends and Family: Not on file   Attends Religious Services: Not on file   Active Member of Clubs or Organizations: Not on file   Attends [employer] Meetings: Not on file   Marital Status: Not on file  Intimate Partner Violence:    Fear of Current or Ex-Partner: Not on file   Emotionally Abused: Not on file   Physically Abused: Not on file   Sexually Abused: Not on file    No past surgical history on file.  Family History  Problem Relation Age of Onset  Diabetes Mother    Hypertension Mother    Stroke Maternal Grandfather    Coronary artery disease Maternal Uncle    Other Neg Hx        no FH of colon cancer    No Known Allergies  Current Outpatient Medications on File Prior to Visit  Medication Sig Dispense Refill   amLODipine (NORVASC) 5 MG tablet Take 1 tablet (5 mg total) by mouth daily. 30 tablet 3   aspirin EC 81 MG tablet Take 81 mg by mouth daily.     atorvastatin (LIPITOR) 20 MG tablet TAKE 1 TABLET BY MOUTH EVERY DAY 90 tablet 1   Omega-3 Fatty Acids (FISH OIL) 1000 MG CAPS Take 2,000 mg by mouth daily.      No current facility-administered medications on file prior to visit.    BP (!) 148/89 (BP Location: Right Arm, Patient Position: Sitting, Cuff Size: Large)    Pulse 70    Temp 97.8 F (36.6 C) (Temporal)    Resp 16    Ht 5\' 11"  (1.803 m)    Wt 211 lb (95.7 kg)    SpO2 98%    BMI 29.43 kg/m    Objective:   Physical  Exam Constitutional:      General: He is not in acute distress.    Appearance: He is well-developed.  HENT:     Head: Normocephalic and atraumatic.  Cardiovascular:     Rate and Rhythm: Normal rate and regular rhythm.     Heart sounds: No murmur heard.   Pulmonary:     Effort: Pulmonary effort is normal. No respiratory distress.     Breath sounds: Normal breath sounds. No wheezing or rales.  Musculoskeletal:     Comments: Left wrist is without swelling or tenderness.  No erythema is noted.  Skin:    General: Skin is warm and dry.  Neurological:     Mental Status: He is alert and oriented to person, place, and time.  Psychiatric:        Behavior: Behavior normal.        Thought Content: Thought content normal.           Assessment & Plan:  Hypertension-blood pressure is elevated here today in the office.  He reports better readings at home.  Advised the patient that I would like for him to check his blood pressure once daily at home for 3 days and then send me his readings via MyChart.  Continue amlodipine 5 mg p.o. daily.  He states he has had his third Covid vaccine.  He will send the date via MyChart.  Hyperlipidemia-most recent set of lipids was stable.  Continue atorvastatin 20 mg p.o. daily.  Hyperglycemia-noted on last blood draw.  Will obtain A1c today.  Left wrist pain-this is mild.  Recommended that he purchase an over-the-counter wrist brace and use ibuprofen as needed. This visit occurred during the SARS-CoV-2 public health emergency.  Safety protocols were in place, including screening questions prior to the visit, additional usage of staff PPE, and extensive cleaning of exam room while observing appropriate contact time as indicated for disinfecting solutions.

## 2020-11-27 ENCOUNTER — Encounter: Payer: Self-pay | Admitting: Family

## 2021-05-04 ENCOUNTER — Ambulatory Visit: Payer: BC Managed Care – PPO | Admitting: Family

## 2021-05-16 ENCOUNTER — Encounter: Payer: Self-pay | Admitting: Family

## 2021-05-16 ENCOUNTER — Other Ambulatory Visit: Payer: Self-pay

## 2021-05-16 ENCOUNTER — Ambulatory Visit: Payer: BC Managed Care – PPO | Admitting: Family

## 2021-05-16 VITALS — BP 122/72 | HR 75 | Temp 98.4°F | Resp 17 | Ht 71.0 in | Wt 210.0 lb

## 2021-05-16 DIAGNOSIS — R739 Hyperglycemia, unspecified: Secondary | ICD-10-CM | POA: Diagnosis not present

## 2021-05-16 DIAGNOSIS — E785 Hyperlipidemia, unspecified: Secondary | ICD-10-CM

## 2021-05-16 DIAGNOSIS — I10 Essential (primary) hypertension: Secondary | ICD-10-CM

## 2021-05-16 LAB — LIPID PANEL
Cholesterol: 127 mg/dL (ref 0–200)
HDL: 33.6 mg/dL — ABNORMAL LOW (ref >39.00)
LDL Cholesterol: 73 mg/dL (ref 0–99)
NonHDL: 93.85
Total CHOL/HDL Ratio: 4
Triglycerides: 102 mg/dL (ref 0.0–149.0)
VLDL: 20.4 mg/dL (ref 0.0–40.0)

## 2021-05-16 LAB — COMPREHENSIVE METABOLIC PANEL
ALT: 53 U/L (ref 0–53)
AST: 23 U/L (ref 0–37)
Albumin: 4.6 g/dL (ref 3.5–5.2)
Alkaline Phosphatase: 55 U/L (ref 39–117)
BUN: 13 mg/dL (ref 6–23)
CO2: 26 mEq/L (ref 19–32)
Calcium: 9.5 mg/dL (ref 8.4–10.5)
Chloride: 103 mEq/L (ref 96–112)
Creatinine, Ser: 0.94 mg/dL (ref 0.40–1.50)
GFR: 97.5 mL/min (ref >60.00)
Glucose, Bld: 129 mg/dL — ABNORMAL HIGH (ref 70–99)
Potassium: 3.9 mEq/L (ref 3.5–5.1)
Sodium: 139 mEq/L (ref 135–145)
Total Bilirubin: 0.9 mg/dL (ref 0.2–1.2)
Total Protein: 6.9 g/dL (ref 6.0–8.3)

## 2021-05-16 LAB — HEMOGLOBIN A1C: Hgb A1c MFr Bld: 5.7 % (ref 4.6–6.5)

## 2021-05-16 MED ORDER — ATORVASTATIN CALCIUM 20 MG PO TABS: 20.0000 mg | ORAL_TABLET | Freq: Every day | ORAL | 1 refills | Status: DC

## 2021-05-16 MED ORDER — AMLODIPINE BESYLATE 5 MG PO TABS: 5.0000 mg | ORAL_TABLET | Freq: Every day | ORAL | 1 refills | Status: DC

## 2021-05-16 NOTE — Progress Notes (Signed)
Subjective:   By signing my name below, I, Shehryar Baig, attest that this documentation has been prepared under the direction and in the presence of Sandford Craze NP. 05/16/2021      Patient ID: [name] [name], male    DOB: [birth year], 45 y.o.   MRN: [id number]  No chief complaint on file.   HPI Patient is in today for a office visit. He is doing well at this time. He denies having any leg swelling, headaches. He is not interested in getting a HIV or hepatitis C screening at this time.   Diet- He is managing his diet well. Hypertension- He is managing his blood pressure well on 5 mg amlodipine daily PO. He is also requesting a refill on amlodipine daily PO. BP Readings from Last 3 Encounters:  05/16/21 122/72  11/20/20 (!) 148/89  07/21/20 118/70   Hyperlipidemia- maintained on atorvastatin.   Past Medical History:  Diagnosis Date  . Abnormal LFTs (liver function tests)    secondary to lovastatin and fatty liver  . Calcaneal fracture 07/31/2017   left  . Fatty liver    history of fatty liver  . Hyperlipidemia   . Hypertension     No past surgical history on file.  Family History  Problem Relation Age of Onset  . Diabetes Mother   . Hypertension Mother   . Stroke Maternal Grandfather   . Coronary artery disease Maternal Uncle   . Other Neg Hx        no FH of colon cancer    Social History   Socioeconomic History  . Marital status: Married    Spouse name: Not on file  . Number of children: Not on file  . Years of education: Not on file  . Highest education level: Not on file  Occupational History  . Not on file  Tobacco Use  . Smoking status: Never Smoker  . Smokeless tobacco: Never Used  Substance and Sexual Activity  . Alcohol use: No    Alcohol/week: 0.0 standard drinks  . Drug use: No  . Sexual activity: Not on file  Other Topics Concern  . Not on file  Social History Narrative   Married - wife is NP no children   Never Smoked   Alcohol  use-no    Drug use-no    Regular exercise-yes     Occupation:  Works as a Magazine features editor for the state      Social Determinants of Corporate investment banker Strain: Not on Ship broker Insecurity: Not on file  Transportation Needs: Not on file  Physical Activity: Not on file  Stress: Not on file  Social Connections: Not on file  Intimate Partner Violence: Not on file    Outpatient Medications Prior to Visit  Medication Sig Dispense Refill  . amLODipine (NORVASC) 5 MG tablet Take 1 tablet (5 mg total) by mouth daily. 90 tablet 1  . aspirin EC 81 MG tablet Take 81 mg by mouth daily.    Marland Kitchen atorvastatin (LIPITOR) 20 MG tablet Take 1 tablet (20 mg total) by mouth daily. 90 tablet 1  . Omega-3 Fatty Acids (FISH OIL) 1000 MG CAPS Take 2,000 mg by mouth daily.      No facility-administered medications prior to visit.    No Known Allergies  ROS     Objective:    Physical Exam Constitutional:      Appearance: Normal appearance.  HENT:     Head: Normocephalic and atraumatic.  Right Ear: External ear normal.     Left Ear: External ear normal.  Eyes:     Extraocular Movements: Extraocular movements intact.     Pupils: Pupils are equal, round, and reactive to light.  Cardiovascular:     Rate and Rhythm: Normal rate and regular rhythm.     Pulses: Normal pulses.     Heart sounds: Normal heart sounds. No murmur heard. No friction rub. No gallop.   Pulmonary:     Effort: Pulmonary effort is normal. No respiratory distress.     Breath sounds: Normal breath sounds. No stridor. No wheezing, rhonchi or rales.  Skin:    General: Skin is warm and dry.  Neurological:     Mental Status: He is alert and oriented to person, place, and time.  Psychiatric:        Behavior: Behavior normal.     There were no vitals taken for this visit. Wt Readings from Last 3 Encounters:  11/20/20 211 lb (95.7 kg)  07/21/20 (!) 214 lb (97.1 kg)  06/19/20 213 lb (96.6 kg)    Diabetic Foot Exam -  Simple   No data filed    Lab Results  Component Value Date   WBC 6.6 07/21/2020   HGB 15.6 07/21/2020   HCT 44.9 07/21/2020   PLT 215.0 07/21/2020   GLUCOSE 108 (H) 11/20/2020   CHOL 142 07/21/2020   TRIG 150.0 (H) 07/21/2020   HDL 34.80 (L) 07/21/2020   LDLDIRECT 152.4 07/10/2010   LDLCALC 77 07/21/2020   ALT 57 (H) 07/21/2020   AST 28 07/21/2020   NA 143 11/20/2020   K 4.2 11/20/2020   CL 105 11/20/2020   CREATININE 1.03 11/20/2020   BUN 9 11/20/2020   CO2 29 11/20/2020   TSH 1.10 07/21/2020   HGBA1C 5.7 11/20/2020   MICROALBUR <0.7 02/05/2019    Lab Results  Component Value Date   TSH 1.10 07/21/2020   Lab Results  Component Value Date   WBC 6.6 07/21/2020   HGB 15.6 07/21/2020   HCT 44.9 07/21/2020   MCV 88.1 07/21/2020   PLT 215.0 07/21/2020   Lab Results  Component Value Date   NA 143 11/20/2020   K 4.2 11/20/2020   CO2 29 11/20/2020   GLUCOSE 108 (H) 11/20/2020   BUN 9 11/20/2020   CREATININE 1.03 11/20/2020   BILITOT 1.1 07/21/2020   ALKPHOS 48 07/21/2020   AST 28 07/21/2020   ALT 57 (H) 07/21/2020   PROT 7.1 07/21/2020   ALBUMIN 4.6 07/21/2020   CALCIUM 9.9 11/20/2020   GFR 87.66 11/20/2020   Lab Results  Component Value Date   CHOL 142 07/21/2020   Lab Results  Component Value Date   HDL 34.80 (L) 07/21/2020   Lab Results  Component Value Date   LDLCALC 77 07/21/2020   Lab Results  Component Value Date   TRIG 150.0 (H) 07/21/2020   Lab Results  Component Value Date   CHOLHDL 4 07/21/2020   Lab Results  Component Value Date   HGBA1C 5.7 11/20/2020       Assessment & Plan:   Problem List Items Addressed This Visit   None      No orders of the defined types were placed in this encounter.   I, Sandford Craze NP, personally preformed the services described in this documentation.  All medical record entries made by the scribe were at my direction and in my presence.  I have reviewed the chart and discharge  instructions (if applicable) and agree that the record reflects my personal performance and is accurate and complete. 05/16/2021   I,Shehryar Baig,acting as a Neurosurgeon for Lemont Fillers, NP.,have documented all relevant documentation on the behalf of Lemont Fillers, NP,as directed by  Lemont Fillers, NP while in the presence of Lemont Fillers, NP.   Shehryar [employer]

## 2021-05-16 NOTE — Patient Instructions (Signed)
Please complete lab work prior to leaving.  Let me know if you would like to proceed with screening colonoscopy.

## 2021-05-16 NOTE — Assessment & Plan Note (Signed)
Check A1C.    Discussed screening colo- he would "like to think about it."

## 2021-05-16 NOTE — Assessment & Plan Note (Addendum)
Tolerating atorvastatin 20mg . Continue same.  Obtain follow up lipid panel.

## 2021-05-16 NOTE — Assessment & Plan Note (Signed)
BP at goal. Continue amlodipine 5mg.   

## 2021-10-16 ENCOUNTER — Other Ambulatory Visit: Payer: Self-pay

## 2021-10-16 ENCOUNTER — Ambulatory Visit: Payer: BC Managed Care – PPO | Admitting: Family

## 2021-10-16 VITALS — BP 139/87 | HR 69 | Temp 98.5°F | Resp 16 | Ht 71.0 in | Wt 218.0 lb

## 2021-10-16 DIAGNOSIS — I10 Essential (primary) hypertension: Secondary | ICD-10-CM | POA: Diagnosis not present

## 2021-10-16 DIAGNOSIS — E785 Hyperlipidemia, unspecified: Secondary | ICD-10-CM | POA: Diagnosis not present

## 2021-10-16 DIAGNOSIS — R739 Hyperglycemia, unspecified: Secondary | ICD-10-CM

## 2021-10-16 DIAGNOSIS — Z23 Encounter for immunization: Secondary | ICD-10-CM

## 2021-10-16 LAB — COMPREHENSIVE METABOLIC PANEL
ALT: 71 U/L — ABNORMAL HIGH (ref 0–53)
AST: 33 U/L (ref 0–37)
Albumin: 4.6 g/dL (ref 3.5–5.2)
Alkaline Phosphatase: 58 U/L (ref 39–117)
BUN: 7 mg/dL (ref 6–23)
CO2: 28 mEq/L (ref 19–32)
Calcium: 9.6 mg/dL (ref 8.4–10.5)
Chloride: 103 mEq/L (ref 96–112)
Creatinine, Ser: 0.86 mg/dL (ref 0.40–1.50)
GFR: 103.83 mL/min (ref >60.00)
Glucose, Bld: 103 mg/dL — ABNORMAL HIGH (ref 70–99)
Potassium: 3.8 mEq/L (ref 3.5–5.1)
Sodium: 139 mEq/L (ref 135–145)
Total Bilirubin: 1.1 mg/dL (ref 0.2–1.2)
Total Protein: 7.2 g/dL (ref 6.0–8.3)

## 2021-10-16 MED ORDER — ATORVASTATIN CALCIUM 20 MG PO TABS: 20.0000 mg | ORAL_TABLET | Freq: Every day | ORAL | 1 refills | Status: DC

## 2021-10-16 MED ORDER — AMLODIPINE BESYLATE 5 MG PO TABS: 5.0000 mg | ORAL_TABLET | Freq: Every day | ORAL | 1 refills | Status: DC

## 2021-10-16 NOTE — Patient Instructions (Signed)
Please complete lab work prior to leaving. Continue your work on diet/exercise/weight loss.

## 2021-10-16 NOTE — Assessment & Plan Note (Signed)
BP Readings from Last 3 Encounters:  10/16/21 139/87  05/16/21 122/72  11/20/20 (!) 148/89   Stable on amlodipine 5mg  once daily. Continue same.

## 2021-10-16 NOTE — Assessment & Plan Note (Signed)
Lab Results  Component Value Date   HGBA1C 5.7 05/16/2021   Last A1C was WNL. Monitor.  Wt Readings from Last 3 Encounters:  10/16/21 218 lb (98.9 kg)  05/16/21 210 lb (95.3 kg)  11/20/20 211 lb (95.7 kg)

## 2021-10-16 NOTE — Assessment & Plan Note (Signed)
Lab Results  Component Value Date   CHOL 127 05/16/2021   HDL 33.60 (L) 05/16/2021   LDLCALC 73 05/16/2021   LDLDIRECT 152.4 07/10/2010   TRIG 102.0 05/16/2021   CHOLHDL 4 05/16/2021    Last LDL at goal on atorvastatin 20mg . Continue same.

## 2021-10-16 NOTE — Progress Notes (Signed)
Subjective:   By signing my name below, I, Lyric Barr-McArthur, attest that this documentation has been prepared under the direction and in the presence of Debbrah Alar, NP, 10/16/2021     Patient ID: [name] [name], male    DOB: 11-Mar-1976, 45 y.o.   MRN: [id number]  Chief Complaint  Patient presents with   Hypertension    Here for follow up    HPI Patient is in today for an office visit.   Blood pressure: His blood pressure is within good range during today's visit. He currently takes 5 mg amlodipine to manage her high blood pressure. He denies any swelling in his legs. BP Readings from Last 3 Encounters:  10/16/21 139/87  05/16/21 122/72  11/20/20 (!) 148/89  Cholesterol: He currently takes 20 mg lipitor to control her high cholesterol. Lab Results  Component Value Date   CHOL 127 05/16/2021   HDL 33.60 (L) 05/16/2021   LDLCALC 73 05/16/2021   LDLDIRECT 152.4 07/10/2010   TRIG 102.0 05/16/2021   CHOLHDL 4 05/16/2021  A1C: He was within normal range the last time his A1C was checked but it was on the higher end. He notes he is trying to work on his healthy diet and exercise to help with this.  Lab Results  Component Value Date   HGBA1C 5.7 05/16/2021  Immunizations: He has received his flu shot in the office today.   Health Maintenance Due  Topic Date Due   COLONOSCOPY (Pts 45-32yrs Insurance coverage will need to be confirmed)  Never done    Past Medical History:  Diagnosis Date   Abnormal LFTs (liver function tests)    secondary to lovastatin and fatty liver   Calcaneal fracture 07/31/2017   left   Fatty liver    history of fatty liver   Hyperlipidemia    Hypertension     No past surgical history on file.  Family History  Problem Relation Age of Onset   Diabetes Mother    Hypertension Mother    Stroke Maternal Grandfather    Coronary artery disease Maternal Uncle    Other Neg Hx        no FH of colon cancer    Social History   Socioeconomic  History   Marital status: Married    Spouse name: Not on file   Number of children: Not on file   Years of education: Not on file   Highest education level: Not on file  Occupational History   Not on file  Tobacco Use   Smoking status: Never   Smokeless tobacco: Never  Substance and Sexual Activity   Alcohol use: No    Alcohol/week: 0.0 standard drinks   Drug use: No   Sexual activity: Not on file  Other Topics Concern   Not on file  Social History Narrative   Married - wife is NP no children   Never Smoked   Alcohol use-no    Drug use-no    Regular exercise-yes     Occupation:  Works as a Scientist, 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     amLODipine (NORVASC) 5 MG tablet Take 1 tablet (5 mg total) by mouth daily. 90 tablet 1   atorvastatin (LIPITOR) 20 MG tablet Take 1 tablet (20 mg total) by mouth daily. 90 tablet 1   No facility-administered medications prior to visit.    No Known Allergies  Review of Systems  Cardiovascular:  Negative for leg swelling.      Objective:    Physical Exam Constitutional:      General: He is not in acute distress.    Appearance: Normal appearance. He is not ill-appearing.  HENT:     Head: Normocephalic and atraumatic.     Right Ear: External ear normal.     Left Ear: External ear normal.  Eyes:     Extraocular Movements: Extraocular movements intact.     Pupils: Pupils are equal, round, and reactive to light.  Cardiovascular:     Rate and Rhythm: Normal rate and regular rhythm.     Heart sounds: Normal heart sounds. No murmur  heard.   No gallop.  Pulmonary:     Effort: Pulmonary effort is normal. No respiratory distress.     Breath sounds: Normal breath sounds. No wheezing or rales.  Lymphadenopathy:     Cervical: No cervical adenopathy.  Skin:    General: Skin is warm and dry.  Neurological:     Mental Status: He is alert and oriented to person, place, and time.  Psychiatric:        Behavior: Behavior normal.        Judgment: Judgment normal.    BP 139/87 (BP Location: Right Arm, Patient Position: Sitting, Cuff Size: Large)   Pulse 69   Temp 98.5 F (36.9 C) (Oral)   Resp 16   Ht _0  (1.803 m)   Wt 218 lb (98.9 kg)   SpO2 99%   BMI 30.40 kg/m  Wt Readings from Last 3 Encounters:  10/16/21 218 lb (98.9 kg)  05/16/21 210 lb (95.3 kg)  11/20/20 211 lb (95.7 kg)       Assessment & Plan:   Problem List Items Addressed This Visit       Unprioritized   Hyperlipidemia    Lab Results  Component Value Date   CHOL 127 05/16/2021   HDL 33.60 (L) 05/16/2021   LDLCALC 73 05/16/2021   LDLDIRECT 152.4 07/10/2010   TRIG 102.0 05/16/2021   CHOLHDL 4 05/16/2021   Last LDL at goal on atorvastatin 55m. Continue same.      Relevant Medications   amLODipine (NORVASC) 5 MG tablet   atorvastatin (LIPITOR) 20 MG tablet   Hyperglycemia    Lab Results  Component Value Date   HGBA1C 5.7 05/16/2021  Last A1C was WNL. Monitor.  Wt Readings from Last 3 Encounters:  10/16/21 218 lb (98.9 kg)  05/16/21 210 lb (95.3 kg)  11/20/20 211 lb (95.7 kg)        Relevant Orders   Comp Met (CMET)   HTN (hypertension)    BP Readings from Last 3 Encounters:  10/16/21 139/87  05/16/21 122/72  11/20/20 (!) 148/89  Stable on amlodipine 58monce daily. Continue same.       Relevant Medications   amLODipine (NORVASC) 5 MG tablet   atorvastatin (LIPITOR) 20 MG tablet   Other Relevant Orders   Comp Met (CMET)   Meds ordered this encounter  Medications   amLODipine (NORVASC) 5 MG tablet  Sig: Take 1  tablet (5 mg total) by mouth daily.    Dispense:  90 tablet    Refill:  1   atorvastatin (LIPITOR) 20 MG tablet    Sig: Take 1 tablet (20 mg total) by mouth daily.    Dispense:  90 tablet    Refill:  1    I, Debbrah Alar, NP, personally preformed the services described in this documentation.  All medical record entries made by the scribe were at my direction and in my presence.  I have reviewed the chart and discharge instructions (if applicable) and agree that the record reflects my personal performance and is accurate and complete. 10/16/2021  I,Lyric Barr-McArthur,acting as a Education administrator for Nance Pear, NP.,have documented all relevant documentation on the behalf of Nance Pear, NP,as directed by  Nance Pear, NP while in the presence of Nance Pear, NP.  Nance Pear, NP

## 2021-11-19 ENCOUNTER — Encounter: Payer: Self-pay | Admitting: Family

## 2021-11-27 ENCOUNTER — Other Ambulatory Visit: Payer: Self-pay

## 2021-11-27 ENCOUNTER — Ambulatory Visit: Payer: BC Managed Care – PPO | Admitting: Family

## 2021-11-27 DIAGNOSIS — J45909 Unspecified asthma, uncomplicated: Secondary | ICD-10-CM

## 2021-11-27 DIAGNOSIS — I10 Essential (primary) hypertension: Secondary | ICD-10-CM | POA: Diagnosis not present

## 2021-11-27 MED ORDER — ALBUTEROL SULFATE HFA 108 (90 BASE) MCG/ACT IN AERS: 2.0000 | INHALATION_SPRAY | Freq: Four times a day (QID) | RESPIRATORY_TRACT | 5 refills | Status: DC | PRN

## 2021-11-27 NOTE — Patient Instructions (Addendum)
Begin taking 2 puffs of pro air inhaler daily.  Return if symptoms worsen.

## 2021-11-27 NOTE — Progress Notes (Signed)
Subjective:   By signing my name below, I, Lyric Barr-McArthur, attest that this documentation has been prepared under the direction and in the presence of Sandford Craze, NP, 11/27/2021    Patient ID: [name] [name], male    DOB: 05-11-1976, 45 y.o.   MRN: [id number]  Chief Complaint  Patient presents with   Cough    Bronchial sx going on 2 weeks .  (Dry cough mostly with cold)    HPI Patient is in today for an office visit.  Dry cough: He has been experiencing a dry cough for the last two weeks. He reports that he was not sick leading up to this cough and that it is worsened in the cold weather when he does not wear enough clothing layers. He denies any wheezing or feeling like his breathing is tight. He also denies any rhinorrhea or sinus concerns. He mentions that when he takes his wife's pro air inhaler his cough resolves.   Health Maintenance Due  Topic Date Due   Pneumococcal Vaccine 26-45 Years old (1 - PCV) Never done   COLONOSCOPY (Pts 45-57yrs Insurance coverage will need to be confirmed)  Never done   COVID-19 Vaccine (4 - Booster for [employer] series) 12/02/2020    Past Medical History:  Diagnosis Date   Abnormal LFTs (liver function tests)    secondary to lovastatin and fatty liver   Calcaneal fracture 07/31/2017   left   Fatty liver    history of fatty liver   Hyperlipidemia    Hypertension     No past surgical history on file.  Family History  Problem Relation Age of Onset   Diabetes Mother    Hypertension Mother    Stroke Maternal Grandfather    Coronary artery disease Maternal Uncle    Other Neg Hx        no FH of colon cancer    Social History   Socioeconomic History   Marital status: Married    Spouse name: Not on file   Number of children: Not on file   Years of education: Not on file   Highest education level: Not on file  Occupational History   Not on file  Tobacco Use   Smoking status: Never   Smokeless tobacco: Never  Substance and  Sexual Activity   Alcohol use: No    Alcohol/week: 0.0 standard drinks   Drug use: No   Sexual activity: Not on file  Other Topics Concern   Not on file  Social History Narrative   Married - wife is NP no children   Never Smoked   Alcohol use-no    Drug use-no    Regular exercise-yes     Occupation:  Works as a Magazine features editor for the state      Social Determinants of Corporate investment banker Strain: Not on Ship broker Insecurity: Not on file  Transportation Needs: Not on file  Physical Activity: Not on file  Stress: Not on file  Social Connections: Not on file  Intimate Partner Violence: Not on file    Outpatient Medications Prior to Visit  Medication Sig Dispense Refill   amLODipine (NORVASC) 5 MG tablet Take 1 tablet (5 mg total) by mouth daily. 90 tablet 1   atorvastatin (LIPITOR) 20 MG tablet Take 1 tablet (20 mg total) by mouth daily. 90 tablet 1   Omega-3 Fatty Acids (FISH OIL) 1000 MG CAPS Take 2,000 mg by mouth daily.     No facility-administered  medications prior to visit.    No Known Allergies  Review of Systems  HENT:  Negative for congestion, sinus pain and sore throat.   Respiratory:  Positive for cough (dry cough). Negative for sputum production, shortness of breath and wheezing.       Objective:    Physical Exam Constitutional:      General: He is not in acute distress.    Appearance: Normal appearance. He is not ill-appearing.  HENT:     Head: Normocephalic and atraumatic.     Right Ear: External ear normal.     Left Ear: External ear normal.  Eyes:     Extraocular Movements: Extraocular movements intact.     Pupils: Pupils are equal, round, and reactive to light.  Cardiovascular:     Rate and Rhythm: Normal rate and regular rhythm.     Heart sounds: Normal heart sounds. No murmur heard.   No gallop.  Pulmonary:     Effort: Pulmonary effort is normal. No respiratory distress.     Breath sounds: Wheezing (mild in base of right lung) present.  No rales.  Lymphadenopathy:     Cervical: No cervical adenopathy.  Skin:    General: Skin is warm and dry.  Neurological:     Mental Status: He is alert and oriented to person, place, and time. Mental status is at baseline.  Psychiatric:        Behavior: Behavior normal.        Judgment: Judgment normal.    BP 116/70   Pulse 70   Ht 5\' 10"  (1.778 m)   Wt 216 lb (98 kg)   SpO2 97%   BMI 30.99 kg/m  Wt Readings from Last 3 Encounters:  11/27/21 216 lb (98 kg)  10/16/21 218 lb (98.9 kg)  05/16/21 210 lb (95.3 kg)       Assessment & Plan:   Problem List Items Addressed This Visit       Unprioritized   HTN (hypertension)    BP Readings from Last 3 Encounters:  11/27/21 116/70  10/16/21 139/87  05/16/21 122/72  Blood pressure is stable on amlodipine 10mg . Continue same.       Asthma    New. Suspect cough is due to asthma given positive response to bronchodilator. Will rx proair and advised pt to take 2 puffs each morning and every 6 hours as needed throughout the day. Could consider addition of an inhaled corticosteroid if symptoms worsen.        Relevant Medications   albuterol (PROAIR HFA) 108 (90 Base) MCG/ACT inhaler   Meds ordered this encounter  Medications   albuterol (PROAIR HFA) 108 (90 Base) MCG/ACT inhaler    Sig: Inhale 2 puffs into the lungs every 6 (six) hours as needed for wheezing or shortness of breath.    Dispense:  6.7 g    Refill:  5    Order Specific Question:   Supervising Provider    Answer:   Penni Homans A [[id number]]    I, Debbrah Alar, NP, personally preformed the services described in this documentation.  All medical record entries made by the scribe were at my direction and in my presence.  I have reviewed the chart and discharge instructions (if applicable) and agree that the record reflects my personal performance and is accurate and complete. 11/27/2021  I,Lyric Barr-McArthur,acting as a scribe for Nance Pear, NP.,have  documented all relevant documentation on the behalf of Nance Pear, NP,as directed by  Nance Pear, NP while in the presence of Nance Pear, NP.  Nance Pear, NP

## 2021-11-27 NOTE — Assessment & Plan Note (Addendum)
New. Suspect cough is due to asthma given positive response to bronchodilator. Will rx proair and advised pt to take 2 puffs each morning and every 6 hours as needed throughout the day. Could consider addition of an inhaled corticosteroid if symptoms worsen.

## 2021-11-27 NOTE — Assessment & Plan Note (Addendum)
BP Readings from Last 3 Encounters:  11/27/21 116/70  10/16/21 139/87  05/16/21 122/72   Blood pressure is stable on amlodipine 10mg . Continue same.

## 2022-05-28 ENCOUNTER — Telehealth: Payer: Self-pay | Admitting: Family

## 2022-05-28 ENCOUNTER — Ambulatory Visit (INDEPENDENT_AMBULATORY_CARE_PROVIDER_SITE_OTHER): Payer: BC Managed Care – PPO | Admitting: Family

## 2022-05-28 DIAGNOSIS — I10 Essential (primary) hypertension: Secondary | ICD-10-CM | POA: Diagnosis not present

## 2022-05-28 MED ORDER — ATORVASTATIN CALCIUM 20 MG PO TABS: 20.0000 mg | ORAL_TABLET | Freq: Every day | ORAL | 1 refills | Status: DC

## 2022-05-28 MED ORDER — AMLODIPINE BESYLATE 10 MG PO TABS: 10.0000 mg | ORAL_TABLET | Freq: Every day | ORAL | 1 refills | Status: DC

## 2022-05-28 MED ORDER — VALSARTAN 160 MG PO TABS: 160.0000 mg | ORAL_TABLET | Freq: Every day | ORAL | 3 refills | Status: DC

## 2022-05-28 NOTE — Assessment & Plan Note (Signed)
BP Readings from Last 3 Encounters:  05/28/22 139/87  11/27/21 116/70  10/16/21 139/87   Looks ok today but that is after losartan 25mg  bid and amlodipine 10mg .  Will change amlodipine from 5mg  to 10mg  tabs.  D/c losartan. Begin Valsartan 160mg  once daily.

## 2022-05-28 NOTE — Progress Notes (Signed)
Subjective:   By signing my name below, I, [name], attest that this documentation has been prepared under the direction and in the presence of Karie Chimera, NP 05/28/2022    Patient ID: [name] [name], male    DOB: Jan 05, 1976, 46 y.o.   MRN: [id number]  Chief Complaint  Patient presents with   Hypertension    Patient reports bp has been elevated at home up to [id number] systolic and up to 90 diastolic     HPI Patient is in today for an office visit. He is with his wife.   Refills - He is requesting a refill of 10 Mg of Amlodipine, 160 Mg of Diovan, and 20 Mg of Lipitor  Blood Pressure - As of today's visit, his blood pressure is elevating. He is currently taking 10 Mg of Amlodipine. He reports that he took his father's 20 Mg of Losartan the night of 05/27/2022 and the morning of 05/28/2022 to decrease his blood pressure. BP Readings from Last 3 Encounters:  05/28/22 139/87  11/27/21 116/70  10/16/21 139/87   Pulse Readings from Last 3 Encounters:  05/28/22 79  11/27/21 70  10/16/21 69    Health Maintenance Due  Topic Date Due   COLONOSCOPY (Pts 45-54yrs Insurance coverage will need to be confirmed)  Never done   COVID-19 Vaccine (4 - Booster for [employer] series) 12/02/2020    Past Medical History:  Diagnosis Date   Abnormal LFTs (liver function tests)    secondary to lovastatin and fatty liver   Calcaneal fracture 07/31/2017   left   Fatty liver    history of fatty liver   Hyperlipidemia    Hypertension     No past surgical history on file.  Family History  Problem Relation Age of Onset   Diabetes Mother    Hypertension Mother    Stroke Maternal Grandfather    Coronary artery disease Maternal Uncle    Other Neg Hx        no FH of colon cancer    Social History   Socioeconomic History   Marital status: Married    Spouse name: Not on file   Number of children: Not on file   Years of education: Not on file   Highest education level: Not on file   Occupational History   Not on file  Tobacco Use   Smoking status: Never   Smokeless tobacco: Never  Substance and Sexual Activity   Alcohol use: No    Alcohol/week: 0.0 standard drinks   Drug use: No   Sexual activity: Not on file  Other Topics Concern   Not on file  Social History Narrative   Married - wife is NP no children   Never Smoked   Alcohol use-no    Drug use-no    Regular exercise-yes     Occupation:  Works as a Scientist, 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 6.7 g 5   Omega-3 Fatty Acids (  FISH OIL) 1000 MG CAPS Take 2,000 mg by mouth daily.     amLODipine (NORVASC) 5 MG tablet Take 1 tablet (5 mg total) by mouth daily. 90 tablet 1   atorvastatin (LIPITOR) 20 MG tablet Take 1 tablet (20 mg total) by mouth daily. 90 tablet 1   No facility-administered medications prior to visit.    No Known Allergies  ROS     Objective:    Physical Exam Constitutional:      General: He is not in acute distress.    Appearance: Normal appearance. He is not ill-appearing.  HENT:     Head: Normocephalic and atraumatic.     Right Ear: External ear normal.     Left Ear: External ear normal.  Eyes:     Extraocular Movements: Extraocular movements intact.     Pupils: Pupils are equal, round, and reactive to light.  Cardiovascular:     Rate and Rhythm: Normal rate and regular rhythm.     Heart sounds: Normal heart sounds. No murmur heard.   No gallop.  Pulmonary:     Effort: Pulmonary effort is normal. No  respiratory distress.     Breath sounds: Normal breath sounds. No wheezing or rales.  Skin:    General: Skin is warm and dry.  Neurological:     Mental Status: He is alert and oriented to person, place, and time.  Psychiatric:        Mood and Affect: Mood normal.        Behavior: Behavior normal.        Judgment: Judgment normal.    BP 139/87 (BP Location: Left Arm, Patient Position: Sitting, Cuff Size: Large)   Pulse 79   Temp 98.3 F (36.8 C) (Oral)   Resp 16   Wt 217 lb (98.4 kg)   SpO2 98%   BMI 31.14 kg/m  Wt Readings from Last 3 Encounters:  05/28/22 217 lb (98.4 kg)  11/27/21 216 lb (98 kg)  10/16/21 218 lb (98.9 kg)       Assessment & Plan:   Problem List Items Addressed This Visit       Unprioritized   HTN (hypertension)    BP Readings from Last 3 Encounters:  05/28/22 139/87  11/27/21 116/70  10/16/21 139/87  Looks ok today but that is after losartan 25mg  bid and amlodipine 10mg .  Will change amlodipine from 5mg  to 10mg  tabs.  D/c losartan. Begin Valsartan 160mg  once daily.      Relevant Medications   atorvastatin (LIPITOR) 20 MG tablet   amLODipine (NORVASC) 10 MG tablet   valsartan (DIOVAN) 160 MG tablet    Meds ordered this encounter  Medications   atorvastatin (LIPITOR) 20 MG tablet    Sig: Take 1 tablet (20 mg total) by mouth daily.    Dispense:  90 tablet    Refill:  1   amLODipine (NORVASC) 10 MG tablet    Sig: Take 1 tablet (10 mg total) by mouth daily.    Dispense:  90 tablet    Refill:  1    Order Specific Question:   Supervising Provider    Answer:   Penni Homans A [[id number]]   valsartan (DIOVAN) 160 MG tablet    Sig: Take 1 tablet (160 mg total) by mouth daily.    Dispense:  90 tablet    Refill:  3    Order Specific Question:   Supervising Provider    Answer:   Penni Homans A [[id number]]  I, Nance Pear, NP, personally preformed the services described in this documentation.  All medical record entries made by the scribe  were at my direction and in my presence.  I have reviewed the chart and discharge instructions (if applicable) and agree that the record reflects my personal performance and is accurate and complete. 05/28/2022   I,Amber Collins,acting as a scribe for Nance Pear, NP.,have documented all relevant documentation on the behalf of Nance Pear, NP,as directed by  Nance Pear, NP while in the presence of Nance Pear, NP.    Nance Pear, NP

## 2022-05-28 NOTE — Telephone Encounter (Signed)
Patient states he's experiencing high BP.. patient declined triage and scheduled appt with MO 5/30 via my chart. Patient states his wife is a NP

## 2022-05-28 NOTE — Telephone Encounter (Signed)
Patient will be seen this afternoon

## 2022-07-16 ENCOUNTER — Ambulatory Visit: Payer: BC Managed Care – PPO | Admitting: Family

## 2022-08-06 ENCOUNTER — Encounter: Payer: Self-pay | Admitting: Family

## 2022-08-06 ENCOUNTER — Ambulatory Visit (INDEPENDENT_AMBULATORY_CARE_PROVIDER_SITE_OTHER): Payer: BC Managed Care – PPO | Admitting: Family

## 2022-08-06 VITALS — BP 128/72 | HR 69 | Temp 97.7°F | Resp 16 | Ht 70.0 in | Wt 213.0 lb

## 2022-08-06 DIAGNOSIS — Z0000 Encounter for general adult medical examination without abnormal findings: Secondary | ICD-10-CM

## 2022-08-06 DIAGNOSIS — R739 Hyperglycemia, unspecified: Secondary | ICD-10-CM | POA: Diagnosis not present

## 2022-08-06 DIAGNOSIS — J45909 Unspecified asthma, uncomplicated: Secondary | ICD-10-CM | POA: Diagnosis not present

## 2022-08-06 DIAGNOSIS — E781 Pure hyperglyceridemia: Secondary | ICD-10-CM | POA: Diagnosis not present

## 2022-08-06 DIAGNOSIS — I10 Essential (primary) hypertension: Secondary | ICD-10-CM

## 2022-08-06 DIAGNOSIS — K760 Fatty (change of) liver, not elsewhere classified: Secondary | ICD-10-CM

## 2022-08-06 LAB — COMPREHENSIVE METABOLIC PANEL
ALT: 39 U/L (ref 0–53)
AST: 22 U/L (ref 0–37)
Albumin: 4.7 g/dL (ref 3.5–5.2)
Alkaline Phosphatase: 57 U/L (ref 39–117)
BUN: 11 mg/dL (ref 6–23)
CO2: 28 mEq/L (ref 19–32)
Calcium: 9.4 mg/dL (ref 8.4–10.5)
Chloride: 104 mEq/L (ref 96–112)
Creatinine, Ser: 0.92 mg/dL (ref 0.40–1.50)
GFR: 99.19 mL/min (ref >60.00)
Glucose, Bld: 114 mg/dL — ABNORMAL HIGH (ref 70–99)
Potassium: 4.2 mEq/L (ref 3.5–5.1)
Sodium: 141 mEq/L (ref 135–145)
Total Bilirubin: 0.7 mg/dL (ref 0.2–1.2)
Total Protein: 7.2 g/dL (ref 6.0–8.3)

## 2022-08-06 LAB — HEMOGLOBIN A1C: Hgb A1c MFr Bld: 5.9 % (ref 4.6–6.5)

## 2022-08-06 LAB — LIPID PANEL
Cholesterol: 106 mg/dL (ref 0–200)
HDL: 32.6 mg/dL — ABNORMAL LOW (ref >39.00)
LDL Cholesterol: 47 mg/dL (ref 0–99)
NonHDL: 73.62
Total CHOL/HDL Ratio: 3
Triglycerides: 132 mg/dL (ref 0.0–149.0)
VLDL: 26.4 mg/dL (ref 0.0–40.0)

## 2022-08-06 NOTE — Assessment & Plan Note (Signed)
Lab Results  Component Value Date   HGBA1C 5.7 05/16/2021

## 2022-08-06 NOTE — Assessment & Plan Note (Signed)
Wt Readings from Last 3 Encounters:  08/06/22 213 lb (96.6 kg)  05/28/22 217 lb (98.4 kg)  11/27/21 216 lb (98 kg)   Continue healthy diet, exercise and weight loss efforts.

## 2022-08-06 NOTE — Assessment & Plan Note (Signed)
BP Readings from Last 3 Encounters:  08/06/22 128/72  05/28/22 139/87  11/27/21 116/70   At goal, continue amlodipine and diovan.

## 2022-08-06 NOTE — Assessment & Plan Note (Signed)
Stable with rare need for albuterol.

## 2022-08-06 NOTE — Progress Notes (Signed)
Subjective:   By signing my name below, I, [name] [name], attest that this documentation has been prepared under the direction and in the presence of Karie Chimera, NP 08/06/2022   Patient ID: [name] [name], male    DOB: September 07, 1976, 46 y.o.   MRN: [id number]  Chief Complaint  Patient presents with   Annual Exam         HPI Patient is in today for a comprehensive physical exam.   Blood Pressure: As of today's visit, his blood pressure is normal. He is currently 10 Mg of Amlodipine and 160 Mg of Diovan.  BP Readings from Last 3 Encounters:  08/06/22 128/72  05/28/22 139/87  11/27/21 116/70   Pulse Readings from Last 3 Encounters:  08/06/22 69  05/28/22 79  11/27/21 70   Asthma: He reports that he is experiencing minimal flare-ups. He doesn't use his Albuterol often   Blood Sugar: His sugar is borderline Lab Results  Component Value Date   HGBA1C 5.7 05/16/2021   Headaches: He reports that his headaches are controlled.  Left Ankle Swelling: He complains of swelling in his left ankle. A few years ago, he injured the area. He states that if he is standing for too long, the area swells.   He denies having any fever, new muscle pain, joint pain , new moles, rashes, congestion, sinus pain, sore throat, palpations, cough, SOB ,wheezing,n/v/d constipation, blood in stool, dysuria, frequency, hematuria, depression, anxiety, headaches at this time  Social history: He reports that his father has hypertension. He reports no recent surgeries.  Colonoscopy: He is not interested in being referred for a colonoscopy or receiving a Cologuard during today's visit.  Immunizations:  Diet: He is maintaining a healthy diet.  Exercise: He regularly exercises by walking around the neighborhood.  Dental: He is UTD on dental exams.  Vision: He is UTD on vision exams.    Health Maintenance Due  Topic Date Due   COLONOSCOPY (Pts 45-31yr Insurance coverage will need to be confirmed)  Never  done   COVID-19 Vaccine (4 - [employer] series) 12/02/2020   INFLUENZA VACCINE  07/30/2022    Past Medical History:  Diagnosis Date   Abnormal LFTs (liver function tests)    secondary to lovastatin and fatty liver   Calcaneal fracture 07/31/2017   left   Fatty liver    history of fatty liver   Hyperlipidemia    Hypertension     History reviewed. No pertinent surgical history.  Family History  Problem Relation Age of Onset   Diabetes Mother    Hypertension Mother    Hypertension Father    Stroke Maternal Grandfather    Coronary artery disease Maternal Uncle    Other Neg Hx        no FH of colon cancer    Social History   Socioeconomic History   Marital status: Married    Spouse name: Not on file   Number of children: Not on file   Years of education: Not on file   Highest education level: Not on file  Occupational History   Not on file  Tobacco Use   Smoking status: Never   Smokeless tobacco: Never  Substance and Sexual Activity   Alcohol use: No    Alcohol/week: 0.0 standard drinks of alcohol   Drug use: No   Sexual activity: Yes    Partners: Female  Other Topics Concern   Not on file  Social History Narrative   Married -  wife is NP no children   Never Smoked   Alcohol use-no    Drug use-no    Regular exercise-yes     Occupation:  Works as a Scientist, 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 6.7 g 5   amLODipine (NORVASC) 10 MG tablet Take 1 tablet (10 mg total) by mouth daily. 90 tablet 1    atorvastatin (LIPITOR) 20 MG tablet Take 1 tablet (20 mg total) by mouth daily. 90 tablet 1   Omega-3 Fatty Acids (FISH OIL) 1000 MG CAPS Take 2,000 mg by mouth daily.     valsartan (DIOVAN) 160 MG tablet Take 1 tablet (160 mg total) by mouth daily. 90 tablet 3   No facility-administered medications prior to visit.    No Known Allergies  Review of Systems  Constitutional:  Negative for fever.  HENT:  Negative for congestion, sinus pain and sore throat.   Respiratory:  Negative for cough, shortness of breath and wheezing.   Cardiovascular:  Negative for palpitations.  Gastrointestinal:  Negative for blood in stool, constipation, diarrhea, nausea and vomiting.  Genitourinary:  Negative for dysuria, frequency and hematuria.  Musculoskeletal:  Negative for joint pain and myalgias.       (+) Left Ankle Swelling  Skin:  Negative for rash.       (-) New Moles  Neurological:  Negative for headaches.  Psychiatric/Behavioral:  Negative for depression. The patient is not nervous/anxious.        Objective:    Physical Exam Constitutional:      General: He is not in acute distress.    Appearance: Normal appearance. He is not ill-appearing.  HENT:     Head: Normocephalic and atraumatic.     Right Ear: Tympanic membrane, ear canal and external ear normal.     Left Ear: Tympanic membrane, ear canal and external ear normal.  Eyes:     Extraocular Movements: Extraocular movements intact.     Pupils: Pupils are equal, round, and reactive to light.  Cardiovascular:     Rate and Rhythm: Normal rate and regular rhythm.     Heart sounds: Normal heart sounds. No murmur heard.    No gallop.  Pulmonary:     Effort: Pulmonary effort is normal. No respiratory distress.     Breath sounds: Normal breath sounds. No wheezing or rales.  Abdominal:     General: Bowel sounds are normal. There is no distension.     Palpations: Abdomen is soft.     Tenderness: There is no abdominal tenderness. There is  no guarding.  Musculoskeletal:     Comments: 5/5 strength in both upper and lower extremities   Mild swelling left ankle  Skin:    General: Skin is warm and dry.  Neurological:     Mental Status: He is alert and oriented to person, place, and time.     Deep Tendon Reflexes:     Reflex Scores:      Patellar reflexes  are 2+ on the right side and 2+ on the left side. Psychiatric:        Mood and Affect: Mood normal.        Behavior: Behavior normal.        Judgment: Judgment normal.     BP 128/72 (BP Location: Left Arm, Patient Position: Sitting, Cuff Size: Large)   Pulse 69   Temp 97.7 F (36.5 C) (Oral)   Resp 16   Ht _0  (1.778 m)   Wt 213 lb (96.6 kg)   SpO2 99%   BMI 30.56 kg/m  Wt Readings from Last 3 Encounters:  08/06/22 213 lb (96.6 kg)  05/28/22 217 lb (98.4 kg)  11/27/21 216 lb (98 kg)       Assessment & Plan:   Problem List Items Addressed This Visit       Unprioritized   Preventative health care - Primary    Wt Readings from Last 3 Encounters:  08/06/22 213 lb (96.6 kg)  05/28/22 217 lb (98.4 kg)  11/27/21 216 lb (98 kg)  Continue healthy diet, exercise and weight loss efforts.        Hyperglycemia    Lab Results  Component Value Date   HGBA1C 5.7 05/16/2021        Relevant Orders   Hemoglobin A1c   HTN (hypertension)    BP Readings from Last 3 Encounters:  08/06/22 128/72  05/28/22 139/87  11/27/21 116/70  At goal, continue amlodipine and diovan.      Fatty liver   Relevant Orders   Comp Met (CMET)   Asthma    Stable with rare need for albuterol.       Other Visit Diagnoses     Hypertriglyceridemia       Relevant Orders   Lipid panel       No orders of the defined types were placed in this encounter.   I, Nance Pear, NP, personally preformed the services described in this documentation.  All medical record entries made by the scribe were at my direction and in my presence.  I have reviewed the chart and  discharge instructions (if applicable) and agree that the record reflects my personal performance and is accurate and complete. 08/06/2022   I,Amber Collins,acting as a scribe for Nance Pear, NP.,have documented all relevant documentation on the behalf of Nance Pear, NP,as directed by  Nance Pear, NP while in the presence of Nance Pear, NP.    Nance Pear, NP

## 2022-08-12 ENCOUNTER — Encounter: Payer: Self-pay | Admitting: Family

## 2022-08-13 ENCOUNTER — Other Ambulatory Visit: Payer: Self-pay | Admitting: *Deleted

## 2022-08-13 NOTE — Progress Notes (Signed)
Letter sent by [facility] 8/14

## 2022-11-20 ENCOUNTER — Other Ambulatory Visit: Payer: Self-pay | Admitting: Family

## 2023-02-03 ENCOUNTER — Other Ambulatory Visit: Payer: Self-pay | Admitting: Family

## 2023-02-03 ENCOUNTER — Encounter: Payer: Self-pay | Admitting: *Deleted

## 2023-02-17 ENCOUNTER — Other Ambulatory Visit: Payer: Self-pay | Admitting: Family

## 2023-02-17 ENCOUNTER — Telehealth: Payer: Self-pay | Admitting: Family

## 2023-02-17 NOTE — Telephone Encounter (Signed)
Please call pt to schedule a follow up visit.

## 2023-02-18 NOTE — Telephone Encounter (Signed)
Pt stated he will look at his sched and call back.

## 2023-03-24 ENCOUNTER — Ambulatory Visit (INDEPENDENT_AMBULATORY_CARE_PROVIDER_SITE_OTHER): Payer: BC Managed Care – PPO | Admitting: Family

## 2023-03-24 VITALS — BP 136/85 | HR 72 | Temp 97.9°F | Resp 16 | Wt 222.0 lb

## 2023-03-24 DIAGNOSIS — E785 Hyperlipidemia, unspecified: Secondary | ICD-10-CM | POA: Diagnosis not present

## 2023-03-24 DIAGNOSIS — R739 Hyperglycemia, unspecified: Secondary | ICD-10-CM

## 2023-03-24 DIAGNOSIS — I10 Essential (primary) hypertension: Secondary | ICD-10-CM

## 2023-03-24 DIAGNOSIS — K219 Gastro-esophageal reflux disease without esophagitis: Secondary | ICD-10-CM

## 2023-03-24 DIAGNOSIS — K760 Fatty (change of) liver, not elsewhere classified: Secondary | ICD-10-CM

## 2023-03-24 DIAGNOSIS — J45909 Unspecified asthma, uncomplicated: Secondary | ICD-10-CM

## 2023-03-24 LAB — COMPREHENSIVE METABOLIC PANEL
ALT: 60 U/L — ABNORMAL HIGH (ref 0–53)
AST: 24 U/L (ref 0–37)
Albumin: 4.6 g/dL (ref 3.5–5.2)
Alkaline Phosphatase: 58 U/L (ref 39–117)
BUN: 11 mg/dL (ref 6–23)
CO2: 27 mEq/L (ref 19–32)
Calcium: 9.3 mg/dL (ref 8.4–10.5)
Chloride: 99 mEq/L (ref 96–112)
Creatinine, Ser: 0.87 mg/dL (ref 0.40–1.50)
GFR: 102.43 mL/min (ref >60.00)
Glucose, Bld: 133 mg/dL — ABNORMAL HIGH (ref 70–99)
Potassium: 3.9 mEq/L (ref 3.5–5.1)
Sodium: 145 mEq/L (ref 135–145)
Total Bilirubin: 0.7 mg/dL (ref 0.2–1.2)
Total Protein: 7.1 g/dL (ref 6.0–8.3)

## 2023-03-24 LAB — LIPID PANEL
Cholesterol: 130 mg/dL (ref 0–200)
HDL: 37.8 mg/dL — ABNORMAL LOW (ref >39.00)
LDL Cholesterol: 63 mg/dL (ref 0–99)
NonHDL: 92.17
Total CHOL/HDL Ratio: 3
Triglycerides: 144 mg/dL (ref 0.0–149.0)
VLDL: 28.8 mg/dL (ref 0.0–40.0)

## 2023-03-24 LAB — HEMOGLOBIN A1C: Hgb A1c MFr Bld: 6.2 % (ref 4.6–6.5)

## 2023-03-24 MED ORDER — VALSARTAN 160 MG PO TABS: 160.0000 mg | ORAL_TABLET | Freq: Every day | ORAL | 1 refills | Status: DC

## 2023-03-24 MED ORDER — AMLODIPINE BESYLATE 10 MG PO TABS: 10.0000 mg | ORAL_TABLET | Freq: Every day | ORAL | 1 refills | Status: DC

## 2023-03-24 MED ORDER — ALBUTEROL SULFATE HFA 108 (90 BASE) MCG/ACT IN AERS: 2.0000 | INHALATION_SPRAY | Freq: Four times a day (QID) | RESPIRATORY_TRACT | 5 refills | Status: DC | PRN

## 2023-03-24 MED ORDER — PANTOPRAZOLE SODIUM 40 MG PO TBEC: 40.0000 mg | DELAYED_RELEASE_TABLET | Freq: Every day | ORAL | 1 refills | Status: DC | PRN

## 2023-03-24 MED ORDER — ATORVASTATIN CALCIUM 20 MG PO TABS: 20.0000 mg | ORAL_TABLET | Freq: Every day | ORAL | 1 refills | Status: DC

## 2023-03-24 NOTE — Assessment & Plan Note (Signed)
Lab Results  Component Value Date   CHOL 106 08/06/2022   HDL 32.60 (L) 08/06/2022   LDLCALC 47 08/06/2022   LDLDIRECT 152.4 07/10/2010   TRIG 132.0 08/06/2022   CHOLHDL 3 08/06/2022   LDL at goal back in August. Continues lipitor.

## 2023-03-24 NOTE — Assessment & Plan Note (Signed)
BP Readings from Last 3 Encounters:  03/24/23 136/85  08/06/22 128/72  05/28/22 139/87   BP at goal on valsartan and amlodipine.

## 2023-03-24 NOTE — Progress Notes (Signed)
Subjective:   By signing my name below, I, Shehryar Baig, attest that this documentation has been prepared under the direction and in the presence of Debbrah Alar, NP. 03/24/2023   Patient ID: [name] [name], male    DOB: 07/20/1976, 47 y.o.   MRN: [id number]  Chief Complaint  Patient presents with   Hypertension    Here for follow up   Gastroesophageal Reflux    Patient complains of acid reflux    Hypertension  Gastroesophageal Reflux   Patient is in today for a follow up visit.   Reflux: He started taking OTC Protonix for that past 3 weeks and reports finding relief while taking it. He is interested in a prescription for it.   Cholesterol: His last cholesterol levels were stable while taking 20 mg Lipitor daily PO. He continues taking fish oil supplements daily.  Lab Results  Component Value Date   CHOL 106 08/06/2022   HDL 32.60 (L) 08/06/2022   LDLCALC 47 08/06/2022   LDLDIRECT 152.4 07/10/2010   TRIG 132.0 08/06/2022   CHOLHDL 3 08/06/2022   Blood pressure: His blood pressure is doing ok during this visit while taking 160 mg valsartan daily PO and 10 mg amlodipine daily PO.  BP Readings from Last 3 Encounters:  03/24/23 136/85  08/06/22 128/72  05/28/22 139/87   Pulse Readings from Last 3 Encounters:  03/24/23 72  08/06/22 69  05/28/22 79   Diet: He is planning on improving his diet.  Wt Readings from Last 3 Encounters:  03/24/23 222 lb (100.7 kg)  08/06/22 213 lb (96.6 kg)  05/28/22 217 lb (98.4 kg)   Breathing: He continues occasionally using albuterol inhaler and reports his breathing improves after taking it.   Colonoscopy: He is not interested in scheduling a colonoscopy at this time. Advised pt that current guidelines are 45 +.    Past Medical History:  Diagnosis Date   Abnormal LFTs (liver function tests)    secondary to lovastatin and fatty liver   Calcaneal fracture 07/31/2017   left   Fatty liver    history of fatty liver    Hyperlipidemia    Hypertension     No past surgical history on file.  Family History  Problem Relation Age of Onset   Diabetes Mother    Hypertension Mother    Hypertension Father    Stroke Maternal Grandfather    Coronary artery disease Maternal Uncle    Other Neg Hx        no FH of colon cancer    Social History   Socioeconomic History   Marital status: Married    Spouse name: Not on file   Number of children: Not on file   Years of education: Not on file   Highest education level: Bachelor's degree (e.g., [employer], [employer], BS)  Occupational History   Not on file  Tobacco Use   Smoking status: Never   Smokeless tobacco: Never  Substance and Sexual Activity   Alcohol use: No    Alcohol/week: 0.0 standard drinks of alcohol   Drug use: No   Sexual activity: Yes    Partners: Female  Other Topics Concern   Not on file  Social History Narrative   Married - wife is NP no children   Never Smoked   Alcohol use-no    Drug use-no    Regular exercise-yes     Occupation:  Works as a Scientist, research (physical sciences) for the [employer]   Financial Resource Strain: Low Risk  (03/21/2023)   Overall Financial Resource Strain (CARDIA)    Difficulty of Paying Living Expenses: Not hard at all  Food Insecurity: No Food Insecurity (03/21/2023)   Hunger Vital Sign    Worried About Running Out of Food in the Last Year: Never true    Ran Out of Food in the Last Year: Never true  Transportation Needs: No Transportation Needs (03/21/2023)   PRAPARE - Hydrologist (Medical): No    Lack of Transportation (Non-Medical): No  Physical Activity: Insufficiently Active (03/21/2023)   Exercise Vital Sign    Days of Exercise per Week: 3 days    Minutes of Exercise per Session: 30 min  Stress: No Stress Concern Present (03/21/2023)   [place]    Feeling of Stress : Only a little  Social Connections:  Unknown (03/21/2023)   Social Connection and Isolation Panel [NHANES]    Frequency of Communication with Friends and Family: Once a week    Frequency of Social Gatherings with Friends and Family: Once a week    Attends Religious Services: More than 4 times per year    Active Member of [employer] or Organizations: Yes    Attends Music therapist: More than 4 times per year    Marital Status: Patient declined  Human resources officer Violence: Not on file    Outpatient Medications Prior to Visit  Medication Sig Dispense Refill   Omega-3 Fatty Acids (FISH OIL) 1000 MG CAPS Take 2,000 mg by mouth daily.     albuterol (PROAIR HFA) 108 (90 Base) MCG/ACT inhaler Inhale 2 puffs into the lungs every 6 (six) hours as needed for wheezing or shortness of breath. 6.7 g 5   amLODipine (NORVASC) 10 MG tablet TAKE 1 TABLET BY MOUTH DAILY 90 tablet 0   atorvastatin (LIPITOR) 20 MG tablet TAKE 1 TABLET BY MOUTH DAILY 90 tablet 0   valsartan (DIOVAN) 160 MG tablet Take 1 tablet (160 mg total) by mouth daily. 90 tablet 3   No facility-administered medications prior to visit.    No Known Allergies  ROS    See HPI  Objective:    Physical Exam Constitutional:      General: He is not in acute distress.    Appearance: Normal appearance. He is not ill-appearing.  HENT:     Head: Normocephalic and atraumatic.     Right Ear: External ear normal.     Left Ear: External ear normal.  Eyes:     Extraocular Movements: Extraocular movements intact.     Pupils: Pupils are equal, round, and reactive to light.  Cardiovascular:     Rate and Rhythm: Normal rate and regular rhythm.     Heart sounds: Normal heart sounds. No murmur heard.    No gallop.  Pulmonary:     Effort: Pulmonary effort is normal. No respiratory distress.     Breath sounds: Normal breath sounds. No wheezing or rales.  Skin:    General: Skin is warm and dry.  Neurological:     Mental Status: He is alert and oriented to person, place,  and time.  Psychiatric:        Judgment: Judgment normal.     BP 136/85 (BP Location: Right Arm, Patient Position: Sitting, Cuff Size: Large)   Pulse 72   Temp 97.9 F (36.6 C) (Oral)   Resp 16   Wt 222  lb (100.7 kg)   SpO2 98%   BMI 31.85 kg/m  Wt Readings from Last 3 Encounters:  03/24/23 222 lb (100.7 kg)  08/06/22 213 lb (96.6 kg)  05/28/22 217 lb (98.4 kg)       Assessment & Plan:  Gastroesophageal reflux disease, unspecified whether esophagitis present Assessment & Plan: Improved with pantoprazole, would like to continue formally. Begin pantoprazole. Advised pt to use PRN.   Hyperlipidemia, unspecified hyperlipidemia type Assessment & Plan: Lab Results  Component Value Date   CHOL 106 08/06/2022   HDL 32.60 (L) 08/06/2022   LDLCALC 47 08/06/2022   LDLDIRECT 152.4 07/10/2010   TRIG 132.0 08/06/2022   CHOLHDL 3 08/06/2022   LDL at goal back in August. Continues lipitor.   Orders: -     Lipid panel -     Comprehensive metabolic panel  Primary hypertension Assessment & Plan: BP Readings from Last 3 Encounters:  03/24/23 136/85  08/06/22 128/72  05/28/22 139/87   BP at goal on valsartan and amlodipine.    Hyperglycemia Assessment & Plan: Lab Results  Component Value Date   HGBA1C 5.9 08/06/2022     Orders: -     Hemoglobin A1c  Mild asthma without complication, unspecified whether persistent Assessment & Plan: Stable with rare use of albuterol.    Fatty liver Assessment & Plan: Reinforced diet/exercise.    Other orders -     Pantoprazole Sodium; Take 1 tablet (40 mg total) by mouth daily as needed.  Dispense: 90 tablet; Refill: 1 -     Valsartan; Take 1 tablet (160 mg total) by mouth daily.  Dispense: 90 tablet; Refill: 1 -     Atorvastatin Calcium; Take 1 tablet (20 mg total) by mouth daily.  Dispense: 90 tablet; Refill: 1 -     amLODIPine Besylate; Take 1 tablet (10 mg total) by mouth daily.  Dispense: 90 tablet; Refill: 1 -      Albuterol Sulfate HFA; Inhale 2 puffs into the lungs every 6 (six) hours as needed for wheezing or shortness of breath.  Dispense: 6.7 g; Refill: 5    I, Nance Pear, NP, personally preformed the services described in this documentation.  All medical record entries made by the scribe were at my direction and in my presence.  I have reviewed the chart and discharge instructions (if applicable) and agree that the record reflects my personal performance and is accurate and complete. 03/24/2023   I,Shehryar Baig,acting as a Education administrator for Nance Pear, NP.,have documented all relevant documentation on the behalf of Nance Pear, NP,as directed by  Nance Pear, NP while in the presence of Nance Pear, NP.   Nance Pear, NP

## 2023-03-24 NOTE — Assessment & Plan Note (Addendum)
Improved with pantoprazole, would like to continue formally. Begin pantoprazole. Advised pt to use PRN.

## 2023-03-24 NOTE — Assessment & Plan Note (Signed)
Reinforced diet/exercise.

## 2023-03-24 NOTE — Assessment & Plan Note (Signed)
Stable with rare use of albuterol.  

## 2023-03-24 NOTE — Assessment & Plan Note (Signed)
Lab Results  Component Value Date   HGBA1C 5.9 08/06/2022

## 2023-03-25 ENCOUNTER — Encounter: Payer: Self-pay | Admitting: Family

## 2023-04-22 ENCOUNTER — Encounter: Payer: Self-pay | Admitting: Family

## 2023-04-23 MED ORDER — VALSARTAN 320 MG PO TABS: 320.0000 mg | ORAL_TABLET | Freq: Every day | ORAL | 1 refills | Status: DC

## 2023-04-24 NOTE — Progress Notes (Addendum)
Subjective:   By signing my name below, I, [name] [name], attest that this documentation has been prepared under the direction and in the presence of Lemont Fillers, NP 04/25/23   Patient ID: [name] [name], male    DOB: Feb 26, 1976, 47 y.o.   MRN: [id number]  Chief Complaint  Patient presents with   Dizziness    HPI Patient is in today for an office visit.   Hypertension: He has been monitoring his blood pressure at home and states it has been elevated. He increased his dose of Valsartan from 160 mg daily to 160 mg bid. He checked his blood pressure yesterday afternoon while he had a headache and reports a reading of 148/90.   Past Medical History:  Diagnosis Date   Abnormal LFTs (liver function tests)    secondary to lovastatin and fatty liver   Calcaneal fracture 07/31/2017   left   Fatty liver    history of fatty liver   Hyperglycemia    Hyperlipidemia    Hypertension     No past surgical history on file.  Family History  Problem Relation Age of Onset   Diabetes Mother    Hypertension Mother    Hypertension Father    Stroke Maternal Grandfather    Coronary artery disease Maternal Uncle    Other Neg Hx        no FH of colon cancer    Social History   Socioeconomic History   Marital status: Married    Spouse name: Not on file   Number of children: Not on file   Years of education: Not on file   Highest education level: Bachelor's degree (e.g., [employer], BS)  Occupational History   Not on file  Tobacco Use   Smoking status: Never   Smokeless tobacco: Never  Substance and Sexual Activity   Alcohol use: No    Alcohol/week: 0.0 standard drinks of alcohol   Drug use: No   Sexual activity: Yes    Partners: Female  Other Topics Concern   Not on file  Social History Narrative   Married - wife is NP no children   Never Smoked   Alcohol use-no    Drug use-no    Regular exercise-yes     Occupation:  Works as a Magazine features editor for the state      Social  Determinants of Corporate investment banker Strain: Low Risk  (03/21/2023)   Overall Financial Resource Strain (CARDIA)    Difficulty of Paying Living Expenses: Not hard at all  Food Insecurity: No Food Insecurity (03/21/2023)   Hunger Vital Sign    Worried About Running Out of Food in the Last Year: Never true    Ran Out of Food in the Last Year: Never true  Transportation Needs: No Transportation Needs (03/21/2023)   PRAPARE - Administrator, Civil Service (Medical): No    Lack of Transportation (Non-Medical): No  Physical Activity: Insufficiently Active (03/21/2023)   Exercise Vital Sign    Days of Exercise per Week: 3 days    Minutes of Exercise per Session: 30 min  Stress: No Stress Concern Present (03/21/2023)   [employer] of Occupational Health - Occupational Stress Questionnaire    Feeling of Stress : Only a little  Social Connections: Unknown (03/21/2023)   Social Connection and Isolation Panel [NHANES]    Frequency of Communication with Friends and Family: Once a week    Frequency of Social Gatherings with Friends and  Family: Once a week    Attends Religious Services: More than 4 times per year    Active Member of Clubs or Organizations: Yes    Attends Engineer, structural: More than 4 times per year    Marital Status: Patient declined  Intimate Partner Violence: Not on file    Outpatient Medications Prior to Visit  Medication Sig Dispense Refill   albuterol (PROAIR HFA) 108 (90 Base) MCG/ACT inhaler Inhale 2 puffs into the lungs every 6 (six) hours as needed for wheezing or shortness of breath. 6.7 g 5   amLODipine (NORVASC) 10 MG tablet Take 1 tablet (10 mg total) by mouth daily. 90 tablet 1   atorvastatin (LIPITOR) 20 MG tablet Take 1 tablet (20 mg total) by mouth daily. 90 tablet 1   Omega-3 Fatty Acids (FISH OIL) 1000 MG CAPS Take 2,000 mg by mouth daily.     pantoprazole (PROTONIX) 40 MG tablet Take 1 tablet (40 mg total) by mouth daily as  needed. 90 tablet 1   valsartan (DIOVAN) 320 MG tablet Take 1 tablet (320 mg total) by mouth daily. 90 tablet 1   No facility-administered medications prior to visit.    No Known Allergies  ROS See HPI    Objective:    Physical Exam Constitutional:      General: He is not in acute distress.    Appearance: He is well-developed.  HENT:     Head: Normocephalic and atraumatic.  Cardiovascular:     Rate and Rhythm: Normal rate and regular rhythm.     Heart sounds: No murmur heard. Pulmonary:     Effort: Pulmonary effort is normal. No respiratory distress.     Breath sounds: Normal breath sounds. No wheezing or rales.  Skin:    General: Skin is warm and dry.  Neurological:     Mental Status: He is alert and oriented to person, place, and time.  Psychiatric:        Behavior: Behavior normal.        Thought Content: Thought content normal.     BP 120/70   Pulse 68   Temp 98 F (36.7 C)   Resp 18   Ht 5\' 10"  (1.778 m)   Wt 219 lb (99.3 kg)   SpO2 98%   BMI 31.42 kg/m  Wt Readings from Last 3 Encounters:  04/25/23 219 lb (99.3 kg)  03/24/23 222 lb (100.7 kg)  08/06/22 213 lb (96.6 kg)       Assessment & Plan:  Primary hypertension Assessment & Plan: Pt is currently taking valsartan 160mg  twice daily and having sbp in the 140's in the afternoon.  AM bp's have been better. I advised pt to change valsartan to 320mg  once daily in the AM. Hopefully this will smooth out his afternoon bp's. I have encouraged him to share any high readings he may get at home with me.   Orders: -     Basic metabolic panel     I,Rachel Rivera,acting as a scribe for Lemont Fillers, NP.,have documented all relevant documentation on the behalf of Lemont Fillers, NP,as directed by  Lemont Fillers, NP while in the presence of Lemont Fillers, NP.   I, Lemont Fillers, NP, personally preformed the services described in this documentation.  All medical record entries  made by the scribe were at my direction and in my presence.  I have reviewed the chart and discharge instructions (if applicable) and agree that the record  reflects my personal performance and is accurate and complete. 04/25/23   Lemont Fillers, NP

## 2023-04-25 ENCOUNTER — Ambulatory Visit: Payer: BC Managed Care – PPO | Admitting: Family

## 2023-04-25 VITALS — BP 120/70 | HR 68 | Temp 98.0°F | Resp 18 | Ht 70.0 in | Wt 219.0 lb

## 2023-04-25 DIAGNOSIS — I10 Essential (primary) hypertension: Secondary | ICD-10-CM

## 2023-04-25 LAB — BASIC METABOLIC PANEL
BUN: 9 mg/dL (ref 6–23)
CO2: 27 mEq/L (ref 19–32)
Calcium: 9.4 mg/dL (ref 8.4–10.5)
Chloride: 104 mEq/L (ref 96–112)
Creatinine, Ser: 1.02 mg/dL (ref 0.40–1.50)
GFR: 87.2 mL/min (ref >60.00)
Glucose, Bld: 104 mg/dL — ABNORMAL HIGH (ref 70–99)
Potassium: 4 mEq/L (ref 3.5–5.1)
Sodium: 140 mEq/L (ref 135–145)

## 2023-04-25 NOTE — Assessment & Plan Note (Signed)
Pt is currently taking valsartan 160mg  twice daily and having sbp in the 140's in the afternoon.  AM bp's have been better. I advised pt to change valsartan to 320mg  once daily in the AM. Hopefully this will smooth out his afternoon bp's. I have encouraged him to share any high readings he may get at home with me.

## 2023-04-28 ENCOUNTER — Encounter: Payer: Self-pay | Admitting: Family

## 2023-04-28 MED ORDER — METOPROLOL SUCCINATE ER 50 MG PO TB24: 50.0000 mg | ORAL_TABLET | Freq: Every day | ORAL | 1 refills | Status: DC

## 2023-06-06 ENCOUNTER — Encounter: Payer: Self-pay | Admitting: Family

## 2023-09-05 ENCOUNTER — Emergency Department (HOSPITAL_COMMUNITY)
Admission: EM | Admit: 2023-09-05 | Discharge: 2023-09-05 | Disposition: A | Discharge status: Home / Self Care | Payer: BC Managed Care – PPO | Attending: Emergency Medicine | Admitting: Emergency Medicine

## 2023-09-05 ENCOUNTER — Encounter (HOSPITAL_COMMUNITY): Payer: Self-pay

## 2023-09-05 ENCOUNTER — Other Ambulatory Visit: Payer: Self-pay

## 2023-09-05 ENCOUNTER — Emergency Department (HOSPITAL_COMMUNITY): Payer: BC Managed Care – PPO

## 2023-09-05 DIAGNOSIS — N200 Calculus of kidney: Secondary | ICD-10-CM

## 2023-09-05 DIAGNOSIS — R1033 Periumbilical pain: Secondary | ICD-10-CM | POA: Diagnosis present

## 2023-09-05 DIAGNOSIS — N132 Hydronephrosis with renal and ureteral calculous obstruction: Secondary | ICD-10-CM | POA: Insufficient documentation

## 2023-09-05 LAB — CBC WITH DIFFERENTIAL/PLATELET
Abs Immature Granulocytes: 0.03 10*3/uL (ref 0.00–0.07)
Basophils Absolute: 0 10*3/uL (ref 0.0–0.1)
Basophils Relative: 0 %
Eosinophils Absolute: 0.1 10*3/uL (ref 0.0–0.5)
Eosinophils Relative: 2 %
HCT: 46.1 % (ref 39.0–52.0)
Hemoglobin: 15.6 g/dL (ref 13.0–17.0)
Immature Granulocytes: 0 %
Lymphocytes Relative: 37 %
Lymphs Abs: 2.6 10*3/uL (ref 0.7–4.0)
MCH: 30.2 pg (ref 26.0–34.0)
MCHC: 33.8 g/dL (ref 30.0–36.0)
MCV: 89.2 fL (ref 80.0–100.0)
Monocytes Absolute: 0.7 10*3/uL (ref 0.1–1.0)
Monocytes Relative: 10 %
Neutro Abs: 3.6 10*3/uL (ref 1.7–7.7)
Neutrophils Relative %: 51 %
Platelets: 191 10*3/uL (ref 150–400)
RBC: 5.17 MIL/uL (ref 4.22–5.81)
RDW: 12.5 % (ref 11.5–15.5)
WBC: 7.1 10*3/uL (ref 4.0–10.5)
nRBC: 0 % (ref 0.0–0.2)

## 2023-09-05 LAB — COMPREHENSIVE METABOLIC PANEL
ALT: 64 U/L — ABNORMAL HIGH (ref 0–44)
AST: 31 U/L (ref 15–41)
Albumin: 4.4 g/dL (ref 3.5–5.0)
Alkaline Phosphatase: 49 U/L (ref 38–126)
Anion gap: 9 (ref 5–15)
BUN: 16 mg/dL (ref 6–20)
CO2: 26 mmol/L (ref 22–32)
Calcium: 9.4 mg/dL (ref 8.9–10.3)
Chloride: 101 mmol/L (ref 98–111)
Creatinine, Ser: 1.03 mg/dL (ref 0.61–1.24)
GFR, Estimated: >60 mL/min (ref >60)
Glucose, Bld: 108 mg/dL — ABNORMAL HIGH (ref 70–99)
Potassium: 3.6 mmol/L (ref 3.5–5.1)
Sodium: 136 mmol/L (ref 135–145)
Total Bilirubin: 1 mg/dL (ref 0.3–1.2)
Total Protein: 7.7 g/dL (ref 6.5–8.1)

## 2023-09-05 LAB — URINALYSIS, ROUTINE W REFLEX MICROSCOPIC
Bacteria, UA: NONE SEEN
Bilirubin Urine: NEGATIVE
Glucose, UA: 50 mg/dL — AB
Ketones, ur: NEGATIVE mg/dL
Leukocytes,Ua: NEGATIVE
Nitrite: NEGATIVE
Protein, ur: 100 mg/dL — AB
RBC / HPF: >50 RBC/hpf (ref 0–5)
Specific Gravity, Urine: 1.023 (ref 1.005–1.030)
WBC, UA: >50 WBC/hpf (ref 0–5)
pH: 6 (ref 5.0–8.0)

## 2023-09-05 LAB — LIPASE, BLOOD: Lipase: 65 U/L — ABNORMAL HIGH (ref 11–51)

## 2023-09-05 MED ORDER — LACTATED RINGERS IV BOLUS
1000.0000 mL | Freq: Once | INTRAVENOUS | Status: AC
  Administered 2023-09-05: 1000 mL via INTRAVENOUS

## 2023-09-05 MED ORDER — KETOROLAC TROMETHAMINE 30 MG/ML IJ SOLN
15.0000 mg | Freq: Once | INTRAMUSCULAR | Status: AC
  Administered 2023-09-05: 15 mg via INTRAVENOUS
  Filled 2023-09-05: qty 1

## 2023-09-05 MED ORDER — SODIUM CHLORIDE 0.9 % IV SOLN
1.0000 g | Freq: Once | INTRAVENOUS | Status: AC
  Administered 2023-09-05: 1 g via INTRAVENOUS
  Filled 2023-09-05: qty 10

## 2023-09-05 MED ORDER — CEPHALEXIN 500 MG PO CAPS: 500.0000 mg | ORAL_CAPSULE | Freq: Three times a day (TID) | ORAL | 0 refills | Status: DC

## 2023-09-05 MED ORDER — OXYCODONE-ACETAMINOPHEN 5-325 MG PO TABS
1.0000 | ORAL_TABLET | Freq: Three times a day (TID) | ORAL | 0 refills | Status: DC | PRN
Start: 2023-09-05 — End: 2023-09-16

## 2023-09-05 MED ORDER — ONDANSETRON 4 MG PO TBDP: 4.0000 mg | ORAL_TABLET | Freq: Three times a day (TID) | ORAL | 0 refills | Status: DC | PRN

## 2023-09-05 MED ORDER — ONDANSETRON HCL 4 MG/2ML IJ SOLN
4.0000 mg | Freq: Once | INTRAMUSCULAR | Status: AC
  Administered 2023-09-05: 4 mg via INTRAVENOUS
  Filled 2023-09-05: qty 2

## 2023-09-05 MED ORDER — IBUPROFEN 800 MG PO TABS: 800.0000 mg | ORAL_TABLET | Freq: Three times a day (TID) | ORAL | 0 refills | Status: DC | PRN

## 2023-09-05 MED ORDER — TAMSULOSIN HCL 0.4 MG PO CAPS: 0.4000 mg | ORAL_CAPSULE | Freq: Every day | ORAL | 0 refills | Status: DC

## 2023-09-05 MED ORDER — FLUCONAZOLE 150 MG PO TABS
150.0000 mg | ORAL_TABLET | Freq: Once | ORAL | Status: AC
  Administered 2023-09-05: 150 mg via ORAL
  Filled 2023-09-05: qty 1

## 2023-09-05 NOTE — Discharge Instructions (Addendum)
Pain and nausea medications as prescribed.  Also take the antibiotics while urine culture is pending.  Follow-up with the urologist this week.  Return to the ED for worsening pain, fever, not able to urinate or any other concerns.

## 2023-09-05 NOTE — ED Provider Notes (Signed)
[place] EMERGENCY DEPARTMENT AT [facility] Provider Note   CSN: [id number] Arrival date & time: 09/05/23  0001     History  Chief Complaint  Patient presents with   Abdominal Pain    [name] [name] is a 47 y.o. male.  Patient presents with periumbilical abdominal pain that onset around 3 PM.  Pain is progressively worsening and has moved to his right lower quadrant. It has also involved pain in his right lower back.  He notes several episodes of dark urine and pain with urination starting around 5 PM.  Has had nausea but no vomiting.  No fever.  No chest pain or shortness of breath.  No constipation or diarrhea.  On the way to the ED, pain has resolved.  He is now pain-free.  Right sided abdominal pain persists.  He did have some radiation of the pain earlier to his right testicle which is now resolved.  No fever.  No history of similar pain in the past.  Still has appendix and gallbladder.  The history is provided by the patient and the spouse.  Abdominal Pain Associated symptoms: dysuria, hematuria and nausea   Associated symptoms: no chest pain, no cough, no fever, no shortness of breath and no vomiting        Home Medications Prior to Admission medications   Medication Sig Start Date End Date Taking? Authorizing Provider  albuterol (PROAIR HFA) 108 (90 Base) MCG/ACT inhaler Inhale 2 puffs into the lungs every 6 (six) hours as needed for wheezing or shortness of breath. 03/24/23   Sandford Craze, NP  amLODipine (NORVASC) 10 MG tablet Take 1 tablet (10 mg total) by mouth daily. 03/24/23   Sandford Craze, NP  atorvastatin (LIPITOR) 20 MG tablet Take 1 tablet (20 mg total) by mouth daily. 03/24/23   Sandford Craze, NP  metoprolol succinate (TOPROL-XL) 50 MG 24 hr tablet Take 1 tablet (50 mg total) by mouth daily. Take with or immediately following a meal. 04/28/23   Sandford Craze, NP  Omega-3 Fatty Acids (FISH OIL) 1000 MG CAPS Take 2,000 mg by mouth  daily.    [provider]  pantoprazole (PROTONIX) 40 MG tablet Take 1 tablet (40 mg total) by mouth daily as needed. 03/24/23   Sandford Craze, NP  valsartan (DIOVAN) 320 MG tablet Take 1 tablet (320 mg total) by mouth daily. 04/23/23   Sandford Craze, NP      Allergies    Patient has no known allergies.    Review of Systems   Review of Systems  Constitutional:  Negative for activity change, appetite change and fever.  HENT:  Negative for congestion and rhinorrhea.   Respiratory:  Negative for cough, chest tightness and shortness of breath.   Cardiovascular:  Negative for chest pain.  Gastrointestinal:  Positive for abdominal pain and nausea. Negative for vomiting.  Genitourinary:  Positive for dysuria, flank pain, hematuria and testicular pain.  Musculoskeletal:  Positive for back pain.  Neurological:  Negative for dizziness, weakness and headaches.   all other systems are negative except as noted in the HPI and PMH.    Physical Exam Updated Vital Signs BP (!) 155/90 (BP Location: Left Arm)   Pulse 67   Temp 97.8 F (36.6 C) (Oral)   Resp 18   Ht 5\' 11"  (1.803 m)   Wt 97.5 kg   SpO2 99%   BMI 29.99 kg/m  Physical Exam Vitals and nursing note reviewed.  Constitutional:  General: He is not in acute distress.    Appearance: He is well-developed. He is not ill-appearing.  HENT:     Head: Normocephalic and atraumatic.     Mouth/Throat:     Pharynx: No oropharyngeal exudate.  Eyes:     Conjunctiva/sclera: Conjunctivae normal.     Pupils: Pupils are equal, round, and reactive to light.  Neck:     Comments: No meningismus. Cardiovascular:     Rate and Rhythm: Normal rate and regular rhythm.     Heart sounds: Normal heart sounds. No murmur heard. Pulmonary:     Effort: Pulmonary effort is normal. No respiratory distress.     Breath sounds: Normal breath sounds.  Chest:     Chest wall: No tenderness.  Abdominal:     Palpations: Abdomen is soft.      Tenderness: There is no abdominal tenderness. There is no guarding or rebound.     Comments: Periumbilical tenderness.   Genitourinary:    Comments: No testicular tenderness Musculoskeletal:        General: No tenderness. Normal range of motion.     Cervical back: Normal range of motion and neck supple.     Comments: No CVAT  Skin:    General: Skin is warm.  Neurological:     Mental Status: He is alert and oriented to person, place, and time.     Cranial Nerves: No cranial nerve deficit.     Motor: No abnormal muscle tone.     Coordination: Coordination normal.     Comments: No ataxia on finger to nose bilaterally. No pronator drift. 5/5 strength throughout. CN 2-12 intact.Equal grip strength. Sensation intact.   Psychiatric:        Behavior: Behavior normal.     ED Results / Procedures / Treatments   Labs (all labs ordered are listed, but only abnormal results are displayed) Labs Reviewed  LIPASE, BLOOD - Abnormal; Notable for the following components:      Result Value   Lipase 65 (*)    All other components within normal limits  COMPREHENSIVE METABOLIC PANEL - Abnormal; Notable for the following components:   Glucose, Bld 108 (*)    ALT 64 (*)    All other components within normal limits  URINALYSIS, ROUTINE W REFLEX MICROSCOPIC - Abnormal; Notable for the following components:   Color, Urine AMBER (*)    APPearance CLOUDY (*)    Glucose, UA 50 (*)    Hgb urine dipstick LARGE (*)    Protein, ur 100 (*)    All other components within normal limits  URINE CULTURE  CBC WITH DIFFERENTIAL/PLATELET    EKG None  Radiology CT Renal Stone Study  Result Date: 09/05/2023 CLINICAL DATA:  Abdominal/flank pain, stone suspected. Radiating to right flank and right lower quadrant. EXAM: CT ABDOMEN AND PELVIS WITHOUT CONTRAST TECHNIQUE: Multidetector CT imaging of the abdomen and pelvis was performed following the standard protocol without IV contrast. RADIATION DOSE REDUCTION:  This exam was performed according to the departmental dose-optimization program which includes automated exposure control, adjustment of the mA and/or kV according to patient size and/or use of iterative reconstruction technique. COMPARISON:  CT abdomen and pelvis 08/01/2005 and abdominal ultrasound 06/17/2016 FINDINGS: Lower chest: No acute abnormality. Hepatobiliary: Unremarkable liver. Normal gallbladder. No biliary dilation. Pancreas: Unremarkable. Spleen: Unremarkable. Adrenals/Urinary Tract: Normal adrenal glands. Mild right hydronephrosis. 2 mm stone in the mid right ureter. No urinary calculi on the left or left hydronephrosis. Bladder is unremarkable. Stomach/Bowel: Normal  caliber large and small bowel. No bowel wall thickening. The appendix is normal.Stomach is within normal limits. Vascular/Lymphatic: Aortic atherosclerosis. Duplication of the lower IVC. No enlarged abdominal or pelvic lymph nodes. Reproductive: Unremarkable. Other: No free intraperitoneal fluid or air. Musculoskeletal: No acute fracture. IMPRESSION: 1. Mild right hydronephrosis with a 2 mm stone in the mid right ureter. Aortic Atherosclerosis (ICD10-I70.0). Electronically Signed   By: Minerva Fester M.D.   On: 09/05/2023 01:07    Procedures Procedures    Medications Ordered in ED Medications  lactated ringers bolus 1,000 mL (has no administration in time range)  ondansetron (ZOFRAN) injection 4 mg (has no administration in time range)  ketorolac (TORADOL) 30 MG/ML injection 15 mg (has no administration in time range)    ED Course/ Medical Decision Making/ A&P                                 Medical Decision Making Amount and/or Complexity of Data Reviewed Labs: ordered. Decision-making details documented in ED Course. Radiology: ordered and independent interpretation performed. Decision-making details documented in ED Course. ECG/medicine tests: ordered and independent interpretation performed. Decision-making  details documented in ED Course.  Risk Prescription drug management.   Periumbilical right-sided lower abdominal pain with dark urine and dysuria, now resolved.  Vitals are stable.  No distress.  No fever.  Abdomen soft without peritoneal signs.  Consider kidney stone.  Also consider appendicitis though kidney stone appears seems more likely given acute onset of pain which is now resolved and dysuria and hematuria.  UA with many reds and white cells.+ yeast, will send cultures.   CT confirms 2 mm R mid ureteral stone with hydronephrosis. Appendix is normal.  Creatinine is normal.  No leukocytosis.  No fever.  D/w Dr. Pete Glatter of urology. Patient appears well, no fever, no leukocytosis.  Will give prophylactic antibiotics while cultures are pending. Will give diflucan dose as well.  Patient's pain has improved on recheck.  Discussed kidney stone diagnosis.  Will treat pain, nausea and give prophylactic antibiotics while cultures are pending.  Follow-up with urology.  Return to the ED sooner if worsening pain, fever, vomiting, unable to urinate or any other concerns.        Final Clinical Impression(s) / ED Diagnoses Final diagnoses:  None    Rx / DC Orders ED Discharge Orders     None         Corde Antonini, Jeannett Senior, MD 09/05/23 0151

## 2023-09-05 NOTE — ED Triage Notes (Signed)
Periumbilical abdominal pain that manifested ~3PM yesterday. Since then pain has radiated to RLQ and right flank.

## 2023-09-06 LAB — URINE CULTURE: Culture: NO GROWTH

## 2023-09-16 ENCOUNTER — Ambulatory Visit: Payer: BC Managed Care – PPO | Admitting: Family

## 2023-09-16 VITALS — BP 150/88 | HR 68 | Temp 98.1°F | Resp 16 | Wt 221.0 lb

## 2023-09-16 DIAGNOSIS — I10 Essential (primary) hypertension: Secondary | ICD-10-CM

## 2023-09-16 DIAGNOSIS — I700 Atherosclerosis of aorta: Secondary | ICD-10-CM | POA: Diagnosis not present

## 2023-09-16 DIAGNOSIS — E785 Hyperlipidemia, unspecified: Secondary | ICD-10-CM | POA: Diagnosis not present

## 2023-09-16 DIAGNOSIS — K219 Gastro-esophageal reflux disease without esophagitis: Secondary | ICD-10-CM

## 2023-09-16 DIAGNOSIS — R739 Hyperglycemia, unspecified: Secondary | ICD-10-CM | POA: Diagnosis not present

## 2023-09-16 LAB — HEMOGLOBIN A1C: Hgb A1c MFr Bld: 6.2 % (ref 4.6–6.5)

## 2023-09-16 MED ORDER — PANTOPRAZOLE SODIUM 40 MG PO TBEC: 40.0000 mg | DELAYED_RELEASE_TABLET | Freq: Every day | ORAL | Status: DC | PRN

## 2023-09-16 MED ORDER — METOPROLOL SUCCINATE ER 50 MG PO TB24: 75.0000 mg | ORAL_TABLET | Freq: Every day | ORAL | 1 refills | Status: DC

## 2023-09-16 NOTE — Assessment & Plan Note (Signed)
Update A1C

## 2023-09-16 NOTE — Assessment & Plan Note (Signed)
Continues statin. He was concerned about atherosclerosis noted of aorta on recent CT.  Discussed importance of BP control, lipid control on statin, remaining smoke free. Discussed risks/benefits of aspirin 81mg  for primary CV prevention.  He is currently on 81mg .

## 2023-09-16 NOTE — Assessment & Plan Note (Signed)
Discussed importance of BP control, lipid control, weight management, remaining tobacco free.

## 2023-09-16 NOTE — Patient Instructions (Signed)
VISIT SUMMARY:  During your recent visit, we discussed your ongoing issues with kidney stones, atherosclerosis, high blood pressure, acid reflux, and prediabetes. We also talked about your cholesterol levels and the need for colon cancer screening. You reported feeling some discomfort in the area of your previous kidney stone, but no severe pain. You also mentioned a slight increase in your blood pressure, which you monitor at home. We discussed your current medications and made some adjustments to your treatment plan.  YOUR PLAN:  -KIDNEY STONES: You still feel some discomfort from your recent kidney stone. Please keep your upcoming appointment with Urology.   -ATHEROSCLEROSIS: Atherosclerosis is a condition where fat builds up on the walls of your arteries. You are currently managing this with lifestyle modifications and baby aspirin. We discussed the pros and cons of continuing the aspirin, and the decision is up to you.  -HIGH BLOOD PRESSURE: Your blood pressure was slightly elevated during your visit. We will increase your Metoprolol dosage to 75mg  daily and check your blood pressure again in a month.  -ACID REFLUX: You are managing your acid reflux by taking Pantoprazole as needed. Continue to do so.  -COLON CANCER SCREENING: Even though you have no family history of colon cancer, it's important to get screened.  You have declined screening at this time.   -CHOLESTEROL LEVELS: Your cholesterol levels are well controlled with your current statin therapy. Continue taking your medication as prescribed.  -PREDIABETES: Your A1c levels indicate that you are at risk for developing diabetes. We will order an A1c test to check for any progression.  INSTRUCTIONS:  Please follow up in one month with your home blood pressure readings and to assess your response to the increased Metoprolol dose. Also, stop by the lab for an A1c check. You declined the flu shot today, but please consider getting it  during your next visit.

## 2023-09-16 NOTE — Progress Notes (Signed)
Subjective:     Patient ID: [name] [name], male    DOB: 1976/04/12, 47 y.o.   MRN: [id number]  Chief Complaint  Patient presents with   Hypertension    Here for follow up    Hypertension    Discussed the use of AI scribe software for clinical note transcription with the patient, who gave verbal consent to proceed.  History of Present Illness         Patient is a 47 yr old male who presents today for follow up.  He is maintained on valsartan and Toprol xl for BP. Amlodipine was discontinued due to LE edema.  He presented to the ED on 09/05/23 with periumbilical abdominal pain.  CT scan noted 2 mm R mid ureteral stone with hydronephrosis. He notes that the pain has improved, but still has some mild discomfort on the right side of his abdomen. States he is scheduled to see Urology in the next week.   He is concerned about the incidental finding on abdominal CT of atherosclerosis of his aorta.    Health Maintenance Due  Topic Date Due   Colonoscopy  Never done   COVID-19 Vaccine (4 - 2023-24 season) 08/31/2023    Past Medical History:  Diagnosis Date   Abnormal LFTs (liver function tests)    secondary to lovastatin and fatty liver   Calcaneal fracture 07/31/2017   left   Fatty liver    history of fatty liver   Hyperglycemia    Hyperlipidemia    Hypertension     No past surgical history on file.  Family History  Problem Relation Age of Onset   Diabetes Mother    Hypertension Mother    Hypertension Father    Stroke Maternal Grandfather    Coronary artery disease Maternal Uncle    Other Neg Hx        no FH of colon cancer    Social History   Socioeconomic History   Marital status: Married    Spouse name: Not on file   Number of children: Not on file   Years of education: Not on file   Highest education level: Bachelor's degree (e.g., [employer], [employer], BS)  Occupational History   Not on file  Tobacco Use   Smoking status: Never   Smokeless tobacco: Never   Substance and Sexual Activity   Alcohol use: No    Alcohol/week: 0.0 standard drinks of alcohol   Drug use: No   Sexual activity: Yes    Partners: Female  Other Topics Concern   Not on file  Social History Narrative   Married - wife is NP no children   Never Smoked   Alcohol use-no    Drug use-no    Regular exercise-yes     Occupation:  Works as a Magazine features editor for the state      Social Determinants of Corporate investment banker Strain: Low Risk  (03/21/2023)   Overall Financial Resource Strain (CARDIA)    Difficulty of Paying Living Expenses: Not hard at all  Food Insecurity: No Food Insecurity (03/21/2023)   Hunger Vital Sign    Worried About Running Out of Food in the Last Year: Never true    Ran Out of Food in the Last Year: Never true  Transportation Needs: No Transportation Needs (03/21/2023)   PRAPARE - Administrator, Civil Service (Medical): No    Lack of Transportation (Non-Medical): No  Physical Activity: Insufficiently Active (03/21/2023)   Exercise  Vital Sign    Days of Exercise per Week: 3 days    Minutes of Exercise per Session: 30 min  Stress: No Stress Concern Present (03/21/2023)   [employer] of Occupational Health - Occupational Stress Questionnaire    Feeling of Stress : Only a little  Social Connections: Unknown (03/21/2023)   Social Connection and Isolation Panel [NHANES]    Frequency of Communication with Friends and Family: Once a week    Frequency of Social Gatherings with Friends and Family: Once a week    Attends Religious Services: More than 4 times per year    Active Member of [employer] or Organizations: Yes    Attends Engineer, structural: More than 4 times per year    Marital Status: Patient declined  Catering manager Violence: Not on file    Outpatient Medications Prior to Visit  Medication Sig Dispense Refill   albuterol (PROAIR HFA) 108 (90 Base) MCG/ACT inhaler Inhale 2 puffs into the lungs every 6 (six) hours as  needed for wheezing or shortness of breath. 6.7 g 5   atorvastatin (LIPITOR) 20 MG tablet Take 1 tablet (20 mg total) by mouth daily. 90 tablet 1   Omega-3 Fatty Acids (FISH OIL) 1000 MG CAPS Take 2,000 mg by mouth daily.     valsartan (DIOVAN) 320 MG tablet Take 1 tablet (320 mg total) by mouth daily. 90 tablet 1   metoprolol succinate (TOPROL-XL) 50 MG 24 hr tablet Take 1 tablet (50 mg total) by mouth daily. Take with or immediately following a meal. 90 tablet 1   amLODipine (NORVASC) 10 MG tablet Take 1 tablet (10 mg total) by mouth daily. (Patient not taking: Reported on 09/05/2023) 90 tablet 1   cephALEXin (KEFLEX) 500 MG capsule Take 1 capsule (500 mg total) by mouth 3 (three) times daily. 21 capsule 0   ibuprofen (ADVIL) 800 MG tablet Take 1 tablet (800 mg total) by mouth every 8 (eight) hours as needed for moderate pain. 21 tablet 0   ondansetron (ZOFRAN-ODT) 4 MG disintegrating tablet Take 1 tablet (4 mg total) by mouth every 8 (eight) hours as needed. 20 tablet 0   oxyCODONE-acetaminophen (PERCOCET/ROXICET) 5-325 MG tablet Take 1 tablet by mouth every 8 (eight) hours as needed for severe pain. 10 tablet 0   pantoprazole (PROTONIX) 40 MG tablet Take 1 tablet (40 mg total) by mouth daily as needed. 90 tablet 1   tamsulosin (FLOMAX) 0.4 MG CAPS capsule Take 1 capsule (0.4 mg total) by mouth daily. 10 capsule 0   No facility-administered medications prior to visit.    No Known Allergies  ROS See HPI    Objective:    Physical Exam Constitutional:      General: He is not in acute distress.    Appearance: He is well-developed.  HENT:     Head: Normocephalic and atraumatic.  Cardiovascular:     Rate and Rhythm: Normal rate and regular rhythm.     Heart sounds: No murmur heard. Pulmonary:     Effort: Pulmonary effort is normal. No respiratory distress.     Breath sounds: Normal breath sounds. No wheezing or rales.  Skin:    General: Skin is warm and dry.  Neurological:     Mental  Status: He is alert and oriented to person, place, and time.  Psychiatric:        Behavior: Behavior normal.        Thought Content: Thought content normal.      BP Marland Kitchen)  150/88   Pulse 68   Temp 98.1 F (36.7 C) (Oral)   Resp 16   Wt 221 lb (100.2 kg)   SpO2 98%   BMI 30.82 kg/m  Wt Readings from Last 3 Encounters:  09/16/23 221 lb (100.2 kg)  09/05/23 215 lb (97.5 kg)  04/25/23 219 lb (99.3 kg)       Assessment & Plan:   Problem List Items Addressed This Visit       Unprioritized   Hyperlipidemia    Continues statin. He was concerned about atherosclerosis noted of aorta on recent CT.  Discussed importance of BP control, lipid control on statin, remaining smoke free. Discussed risks/benefits of aspirin 81mg  for primary CV prevention.  He is currently on 81mg .       Relevant Medications   metoprolol succinate (TOPROL-XL) 50 MG 24 hr tablet   Hyperglycemia - Primary    Update A1C.       Relevant Orders   HgB A1c   HTN (hypertension)    Uncontrolled, increase toprol xl from 50mg  to 75 mg. Continue Valsartan.       Relevant Medications   metoprolol succinate (TOPROL-XL) 50 MG 24 hr tablet   Gastroesophageal reflux disease    Stable with only prn use of pantoprazole. Continue same.       Relevant Medications   pantoprazole (PROTONIX) 40 MG tablet   Atherosclerosis of aorta (HCC)    Discussed importance of BP control, lipid control, weight management, remaining tobacco free.        Relevant Medications   metoprolol succinate (TOPROL-XL) 50 MG 24 hr tablet    I have discontinued Oakland T. Coba's amLODipine, oxyCODONE-acetaminophen, ondansetron, ibuprofen, cephALEXin, and tamsulosin. I have also changed his metoprolol succinate. Additionally, I am having him maintain his Fish Oil, atorvastatin, albuterol, valsartan, and pantoprazole.  Meds ordered this encounter  Medications   pantoprazole (PROTONIX) 40 MG tablet    Sig: Take 1 tablet (40 mg total) by mouth  daily as needed.    Order Specific Question:   Supervising Provider    Answer:   Danise Edge A [[id number]]   metoprolol succinate (TOPROL-XL) 50 MG 24 hr tablet    Sig: Take 1.5 tablets (75 mg total) by mouth daily. Take with or immediately following a meal.    Dispense:  135 tablet    Refill:  1    Order Specific Question:   Supervising Provider    Answer:   Danise Edge A [[id number]]

## 2023-09-16 NOTE — Assessment & Plan Note (Signed)
Stable with only prn use of pantoprazole. Continue same.

## 2023-09-16 NOTE — Assessment & Plan Note (Addendum)
Uncontrolled, increase toprol xl from 50mg  to 75 mg. Continue Valsartan.

## 2023-09-17 ENCOUNTER — Other Ambulatory Visit: Payer: Self-pay | Admitting: Family

## 2023-09-21 ENCOUNTER — Other Ambulatory Visit: Payer: Self-pay | Admitting: Family

## 2023-10-21 ENCOUNTER — Other Ambulatory Visit: Payer: Self-pay | Admitting: Family

## 2023-10-29 ENCOUNTER — Other Ambulatory Visit: Payer: Self-pay | Admitting: Family

## 2024-01-26 ENCOUNTER — Ambulatory Visit: Payer: Self-pay | Admitting: Family

## 2024-01-26 NOTE — Telephone Encounter (Signed)
  Chief Complaint: Information Only  Disposition: [] ED /[] Urgent Care (no appt availability in office) / [] Appointment(In office/virtual)/ []  [facility]/ [x] Home Care/ [] Refused Recommended Disposition /[]  Mobile Bus/ []  Follow-up with PCP Additional Notes: Patient's call returned for triage, has been evaluated by telehealth provider. Denies further needs at this time. Alerting PCP for review.   Reason for Disposition  Health Information question, no triage required and triager able to answer question  Answer Assessment - Initial Assessment Questions 1. REASON FOR CALL or QUESTION: "What is your reason for calling today?" or "How can I best help you?" or "What question do you have that I can help answer?"     This RN returned call for triage. Patient reports he has already been evaluated by telehealth provider for concerns. Declines triage at this time as his concerns have been addressed.  Protocols used: Information Only Call - No Triage-A-AH

## 2024-01-27 ENCOUNTER — Encounter: Payer: Self-pay | Admitting: Family

## 2024-01-28 MED ORDER — METOPROLOL SUCCINATE ER 50 MG PO TB24: 75.0000 mg | ORAL_TABLET | Freq: Every day | ORAL | 1 refills | Status: DC

## 2024-03-09 ENCOUNTER — Other Ambulatory Visit: Payer: Self-pay | Admitting: Family

## 2024-03-11 ENCOUNTER — Other Ambulatory Visit: Payer: Self-pay | Admitting: Family

## 2024-03-19 ENCOUNTER — Other Ambulatory Visit: Payer: Self-pay | Admitting: Family

## 2024-03-31 ENCOUNTER — Ambulatory Visit: Payer: Self-pay | Admitting: Family

## 2024-03-31 ENCOUNTER — Ambulatory Visit (INDEPENDENT_AMBULATORY_CARE_PROVIDER_SITE_OTHER): Payer: Self-pay | Admitting: Family

## 2024-03-31 VITALS — BP 128/74 | HR 84 | Temp 99.1°F | Resp 16 | Ht 71.0 in | Wt 221.0 lb

## 2024-03-31 DIAGNOSIS — Z0000 Encounter for general adult medical examination without abnormal findings: Secondary | ICD-10-CM | POA: Diagnosis not present

## 2024-03-31 DIAGNOSIS — I10 Essential (primary) hypertension: Secondary | ICD-10-CM

## 2024-03-31 DIAGNOSIS — R748 Abnormal levels of other serum enzymes: Secondary | ICD-10-CM

## 2024-03-31 DIAGNOSIS — E785 Hyperlipidemia, unspecified: Secondary | ICD-10-CM

## 2024-03-31 DIAGNOSIS — R739 Hyperglycemia, unspecified: Secondary | ICD-10-CM

## 2024-03-31 MED ORDER — METOPROLOL SUCCINATE ER 50 MG PO TB24
75.0000 mg | ORAL_TABLET | Freq: Every day | ORAL | 1 refills | Status: DC
Start: 2024-03-31 — End: 2024-08-03

## 2024-03-31 MED ORDER — VALSARTAN 320 MG PO TABS: 320.0000 mg | ORAL_TABLET | Freq: Every day | ORAL | 0 refills | Status: DC

## 2024-03-31 MED ORDER — ATORVASTATIN CALCIUM 20 MG PO TABS: 20.0000 mg | ORAL_TABLET | Freq: Every day | ORAL | 1 refills | Status: DC

## 2024-03-31 NOTE — Assessment & Plan Note (Signed)
 Lab Results  Component Value Date   CHOL 130 03/24/2023   HDL 37.80 (L) 03/24/2023   LDLCALC 63 03/24/2023   LDLDIRECT 152.4 07/10/2010   TRIG 144.0 03/24/2023   CHOLHDL 3 03/24/2023  Last lipid panel looked good. Continue atorvastatin, update lipids.

## 2024-03-31 NOTE — Progress Notes (Signed)
 Subjective:     Patient ID: [name] [name], male    DOB: 1976-03-10, 48 y.o.   MRN: [id number]  Chief Complaint  Patient presents with   Annual Exam    HPI  Discussed the use of AI scribe software for clinical note transcription with the patient, who gave verbal consent to proceed.  History of Present Illness  [name] [name] is a 48 year old male who presents with tension headaches and neck pain.  He has been experiencing tension headaches and neck pain for the past few weeks, which he attributes to prolonged sitting at the computer. The neck pain is described as tense. No recent injuries or neurovascular symptoms. He occasionally wakes up in the middle of the night.  He has a history of elevated lipase levels noted during an ER visit for a kidney stone in September and a history of fatty liver disease. He mentions that his blood sugar has been high, although he has not been diagnosed with diabetes.  He is actively trying to lose weight by eating a healthy diet and walking daily. He reports that his blood pressure is good. He has declined the pneumococcal vaccine. He has had a dental exam in January and an eye exam in December. He is considering a colonoscopy but has not yet decided.  No cough, cold symptoms, leg swelling, hearing or vision problems, digestive issues, or urination problems. No concerns about depression or anxiety.     Health Maintenance Due  Topic Date Due   Pneumococcal Vaccine 64-71 Years old (1 of 2 - PCV) Never done   Colonoscopy  Never done   COVID-19 Vaccine (4 - 2024-25 season) 08/31/2023    Past Medical History:  Diagnosis Date   Abnormal LFTs (liver function tests)    secondary to lovastatin and fatty liver   Calcaneal fracture 07/31/2017   left   Fatty liver    history of fatty liver   Hyperglycemia    Hyperlipidemia    Hypertension     No past surgical history on file.  Family History  Problem Relation Age of Onset   Diabetes Mother     Hypertension Mother    Hypertension Father    Stroke Maternal Grandfather    Coronary artery disease Maternal Uncle    Other Neg Hx        no FH of colon cancer    Social History   Socioeconomic History   Marital status: Married    Spouse name: Not on file   Number of children: Not on file   Years of education: Not on file   Highest education level: Bachelor's degree (e.g., [employer], [employer], BS)  Occupational History   Not on file  Tobacco Use   Smoking status: Never   Smokeless tobacco: Never  Substance and Sexual Activity   Alcohol use: No    Alcohol/week: 0.0 standard drinks of alcohol   Drug use: No   Sexual activity: Yes    Partners: Female  Other Topics Concern   Not on file  Social History Narrative   Married - wife is NP no children   Never Smoked   Alcohol use-no    Drug use-no    Regular exercise-yes     Occupation:  Works as a Magazine features editor for the state      Social Drivers of Corporate investment banker Strain: Low Risk  (03/21/2023)   Overall Financial Resource Strain (CARDIA)    Difficulty of Paying Living Expenses:  Not hard at all  Food Insecurity: No Food Insecurity (03/21/2023)   Hunger Vital Sign    Worried About Running Out of Food in the Last Year: Never true    Ran Out of Food in the Last Year: Never true  Transportation Needs: No Transportation Needs (03/21/2023)   PRAPARE - Administrator, Civil Service (Medical): No    Lack of Transportation (Non-Medical): No  Physical Activity: Insufficiently Active (03/21/2023)   Exercise Vital Sign    Days of Exercise per Week: 3 days    Minutes of Exercise per Session: 30 min  Stress: No Stress Concern Present (03/21/2023)   [employer] of Occupational Health - Occupational Stress Questionnaire    Feeling of Stress : Only a little  Social Connections: Unknown (03/21/2023)   Social Connection and Isolation Panel [NHANES]    Frequency of Communication with Friends and Family: Once a week     Frequency of Social Gatherings with Friends and Family: Once a week    Attends Religious Services: More than 4 times per year    Active Member of [employer] or Organizations: Yes    Attends Engineer, structural: More than 4 times per year    Marital Status: Patient declined  Catering manager Violence: Not on file    Outpatient Medications Prior to Visit  Medication Sig Dispense Refill   albuterol (PROAIR HFA) 108 (90 Base) MCG/ACT inhaler Inhale 2 puffs into the lungs every 6 (six) hours as needed for wheezing or shortness of breath. 6.7 g 5   Omega-3 Fatty Acids (FISH OIL) 1000 MG CAPS Take 2,000 mg by mouth daily.     pantoprazole (PROTONIX) 40 MG tablet Take 1 tablet (40 mg total) by mouth daily as needed.     atorvastatin (LIPITOR) 20 MG tablet TAKE 1 TABLET BY MOUTH DAILY 90 tablet 1   metoprolol succinate (TOPROL-XL) 50 MG 24 hr tablet TAKE 1 TABLET BY MOUTH DAILY WITH A MEAL 90 tablet 1   valsartan (DIOVAN) 320 MG tablet Take 1 tablet (320 mg total) by mouth daily. 90 tablet 0   No facility-administered medications prior to visit.    No Known Allergies  Review of Systems  Constitutional:  Negative for weight loss.  HENT:  Negative for congestion and hearing loss.   Eyes:  Negative for blurred vision.  Respiratory:  Negative for cough.   Cardiovascular:  Negative for leg swelling.  Gastrointestinal:  Negative for constipation and diarrhea.  Genitourinary:  Negative for dysuria and frequency.  Musculoskeletal:  Positive for neck pain.  Skin:  Negative for rash.  Neurological:  Positive for headaches (reports some tension headache).  Psychiatric/Behavioral:         Denies depression/anxiety       Objective:    Physical Exam   BP 128/74 (BP Location: Right Arm, Patient Position: Sitting)   Pulse 84   Temp 99.1 F (37.3 C) (Oral)   Resp 16   Ht 5\' 11"  (1.803 m)   Wt 221 lb (100.2 kg)   SpO2 97%   BMI 30.82 kg/m  Wt Readings from Last 3 Encounters:  03/31/24  221 lb (100.2 kg)  09/16/23 221 lb (100.2 kg)  09/05/23 215 lb (97.5 kg)   Physical Exam  Constitutional: He is oriented to person, place, and time. He appears well-developed and well-nourished. No distress.  HENT:  Head: Normocephalic and atraumatic.  Right Ear: Tympanic membrane and ear canal normal.  Left Ear: Tympanic membrane and ear  canal normal.  Mouth/Throat: Oropharynx is clear and moist.  Eyes: Pupils are equal, round, and reactive to light. No scleral icterus.  Neck: Normal range of motion. No thyromegaly present.  Cardiovascular: Normal rate and regular rhythm.   No murmur heard. Pulmonary/Chest: Effort normal and breath sounds normal. No respiratory distress. He has no wheezes. He has no rales. He exhibits no tenderness.  Abdominal: Soft. Bowel sounds are normal. He exhibits no distension and no mass. There is no tenderness. There is no rebound and no guarding.  Musculoskeletal: He exhibits no edema.  Lymphadenopathy:    He has no cervical adenopathy.  Neurological: He is alert and oriented to person, place, and time. He has normal patellar reflexes. He exhibits normal muscle tone. Coordination normal.  Skin: Skin is warm and dry.  Psychiatric: He has a normal mood and affect. His behavior is normal. Judgment and thought content normal.           Assessment & Plan:       Assessment & Plan:   Problem List Items Addressed This Visit       Unprioritized   Preventative health care - Primary    Up to date with dental and vision exams. Declined pneumococcal vaccination. Discussed importance of colon cancer screening.  - Order lipid panel to assess cholesterol levels. - Order A1c test to evaluate blood sugar control. - Discussed colon cancer screening options, including colonoscopy and cologuard. He declines both at this time. - Encourage continued healthy diet and regular exercise.       Hyperlipidemia   Lab Results  Component Value Date   CHOL 130  03/24/2023   HDL 37.80 (L) 03/24/2023   LDLCALC 63 03/24/2023   LDLDIRECT 152.4 07/10/2010   TRIG 144.0 03/24/2023   CHOLHDL 3 03/24/2023  Last lipid panel looked good. Continue atorvastatin, update lipids.       Relevant Medications   metoprolol succinate (TOPROL-XL) 50 MG 24 hr tablet   valsartan (DIOVAN) 320 MG tablet   atorvastatin (LIPITOR) 20 MG tablet   Other Relevant Orders   Lipid panel   Hyperglycemia   Update A1C.       Relevant Orders   HgB A1c   Comp Met (CMET)   HTN (hypertension)   BP stable on valsartan and metoprolol. Continue same.       Relevant Medications   metoprolol succinate (TOPROL-XL) 50 MG 24 hr tablet   valsartan (DIOVAN) 320 MG tablet   atorvastatin (LIPITOR) 20 MG tablet   Other Visit Diagnoses       Abnormal serum level of lipase       Relevant Orders   Lipase       I have changed Julyan T. Skolnick's metoprolol succinate and atorvastatin. I am also having him maintain his Fish Oil, albuterol, pantoprazole, and valsartan.  Meds ordered this encounter  Medications   metoprolol succinate (TOPROL-XL) 50 MG 24 hr tablet    Sig: Take 1.5 tablets (75 mg total) by mouth daily. Take with or immediately following a meal.    Dispense:  145 tablet    Refill:  1   valsartan (DIOVAN) 320 MG tablet    Sig: Take 1 tablet (320 mg total) by mouth daily.    Dispense:  90 tablet    Refill:  0    Supervising Provider:   Danise Edge A [[id number]]   atorvastatin (LIPITOR) 20 MG tablet    Sig: Take 1 tablet (20 mg total) by mouth daily.  Dispense:  90 tablet    Refill:  1    Supervising Provider:   Danise Edge A [[id number]]

## 2024-03-31 NOTE — Assessment & Plan Note (Signed)
 BP stable on valsartan and metoprolol. Continue same.

## 2024-03-31 NOTE — Progress Notes (Cosign Needed Addendum)
 Established Patient Office Visit  Subjective   Patient ID: [name] [name], male    DOB: Nov 22, 1976  Age: 48 y.o. MRN: [id number]  Chief Complaint  Patient presents with   Annual Exam    Pleasant 48 yo presents for annual physical. Reports that he feels that he is relatively healthy. States over the past two weeks he has noticed tension in his neck. Denies injury. Feels that the tension may be related to working at a computer. Denies radiation or numbness/tingling in the upper extremities.   In review of his medical record, it was noted that he was evaluated in the ED for kidney stones in September of 2024. Incidental finding of elevated lipase at that visit. Patient denies abdominal pain.   Dental: 12/2023 (twice a year) Eye: 11/2023. Wears glasses Colonoscopy: due for colonoscopy (declines, but states he is considering) Diet: states he is eating a healthy diet and trying to lose weight Exercise: Walks daily Immunizations: due for pneumococcal (declines) Tobacco: denies Alcohol: denies Drugs: denies    Review of Systems  Constitutional:  Negative for chills and fever.  HENT:  Negative for hearing loss and nosebleeds.   Eyes:  Negative for blurred vision and double vision.  Respiratory:  Negative for cough and shortness of breath.   Cardiovascular:  Negative for chest pain, palpitations and leg swelling.  Gastrointestinal:  Negative for abdominal pain, constipation and diarrhea.  Genitourinary:  Negative for dysuria, frequency and urgency.  Musculoskeletal:  Positive for neck pain. Negative for joint pain.       Reports tension in his neck.  Skin:  Negative for rash.  Neurological:  Negative for dizziness, weakness and headaches.  Endo/Heme/Allergies:  Negative for environmental allergies. Does not bruise/bleed easily.  Psychiatric/Behavioral:  Negative for depression.       Objective:     BP 128/74 (BP Location: Right Arm, Patient Position: Sitting)   Pulse 84   Temp  99.1 F (37.3 C) (Oral)   Resp 16   Ht 5\' 11"  (1.803 m)   Wt 221 lb (100.2 kg)   SpO2 97%   BMI 30.82 kg/m    Physical Exam Vitals reviewed.  Constitutional:      Appearance: Normal appearance.  HENT:     Head: Normocephalic and atraumatic.     Right Ear: Tympanic membrane and external ear normal.     Left Ear: Tympanic membrane and external ear normal.     Nose: No congestion or rhinorrhea.     Mouth/Throat:     Mouth: Mucous membranes are moist.     Pharynx: Oropharynx is clear.  Eyes:     Extraocular Movements: Extraocular movements intact.     Conjunctiva/sclera: Conjunctivae normal.     Pupils: Pupils are equal, round, and reactive to light.  Cardiovascular:     Rate and Rhythm: Normal rate and regular rhythm.     Pulses: Normal pulses.     Heart sounds: Normal heart sounds.  Pulmonary:     Effort: Pulmonary effort is normal.     Breath sounds: Normal breath sounds.  Musculoskeletal:     Cervical back: Neck supple.  Lymphadenopathy:     Cervical: No cervical adenopathy.  Skin:    General: Skin is warm and dry.     Capillary Refill: Capillary refill takes less than 2 seconds.  Neurological:     Mental Status: He is alert and oriented to person, place, and time.  Psychiatric:        Mood  and Affect: Mood normal.        Behavior: Behavior normal.        Thought Content: Thought content normal.      Assessment & Plan:   -Health maintenance - declines pneumococcal vaccine at this time. Also declines colonoscopy. Educated patient on the importance of screening colonoscopies. He verbalized understanding and stated he would think about it. Patient reports his preventative dental exams and vision exams are up-to-date. CMP, lipid panel, lipase, and A1C today. Encouraged patient to continue to work on eating a healthy diet of lean proteins, fruits, and vegetables while avoiding sugars and simple carbohydrates.  -Hyperlipidemia - lipid panel today. Continue atorvastatin,  healthy diet, and exercise.   -Hyperglycemia - A1C and CMP today. Continue weight loss efforts, healthy diet, and exercise.   -Hypertension - normotensive today. Continue valsartan and metoprolol. Continue weight loss efforts, healthy diet, and exercise.  -Elevated serum lipase - serum lipase today.   Cristopher Peru, RN

## 2024-03-31 NOTE — Assessment & Plan Note (Addendum)
  Up to date with dental and vision exams. Declined pneumococcal vaccination. Discussed importance of colon cancer screening.  - Order lipid panel to assess cholesterol levels. - Order A1c test to evaluate blood sugar control. - Discussed colon cancer screening options, including colonoscopy and cologuard. He declines both at this time. - Encourage continued healthy diet and regular exercise.

## 2024-03-31 NOTE — Assessment & Plan Note (Signed)
 Update A1C

## 2024-04-01 ENCOUNTER — Telehealth: Payer: Self-pay | Admitting: Family

## 2024-04-01 DIAGNOSIS — R748 Abnormal levels of other serum enzymes: Secondary | ICD-10-CM

## 2024-04-01 LAB — COMPREHENSIVE METABOLIC PANEL WITH GFR
ALT: 60 U/L — ABNORMAL HIGH (ref 0–53)
AST: 25 U/L (ref 0–37)
Albumin: 4.7 g/dL (ref 3.5–5.2)
Alkaline Phosphatase: 49 U/L (ref 39–117)
BUN: 14 mg/dL (ref 6–23)
CO2: 27 meq/L (ref 19–32)
Calcium: 9.5 mg/dL (ref 8.4–10.5)
Chloride: 106 meq/L (ref 96–112)
Creatinine, Ser: 0.88 mg/dL (ref 0.40–1.50)
GFR: 101.35 mL/min (ref >60.00)
Glucose, Bld: 131 mg/dL — ABNORMAL HIGH (ref 70–99)
Potassium: 4.2 meq/L (ref 3.5–5.1)
Sodium: 141 meq/L (ref 135–145)
Total Bilirubin: 1 mg/dL (ref 0.2–1.2)
Total Protein: 7.2 g/dL (ref 6.0–8.3)

## 2024-04-01 LAB — LIPID PANEL
Cholesterol: 132 mg/dL (ref 0–200)
HDL: 32.9 mg/dL — ABNORMAL LOW (ref >39.00)
NonHDL: 99.57
Total CHOL/HDL Ratio: 4
Triglycerides: 408 mg/dL — ABNORMAL HIGH (ref 0.0–149.0)
VLDL: 81.6 mg/dL — ABNORMAL HIGH (ref 0.0–40.0)

## 2024-04-01 LAB — HEMOGLOBIN A1C: Hgb A1c MFr Bld: 6.1 % (ref 4.6–6.5)

## 2024-04-01 LAB — LDL CHOLESTEROL, DIRECT: Direct LDL: 76 mg/dL

## 2024-04-01 LAB — LIPASE: Lipase: 71 U/L — ABNORMAL HIGH (ref 11.0–59.0)

## 2024-04-01 MED ORDER — FENOFIBRATE 145 MG PO TABS: 145.0000 mg | ORAL_TABLET | Freq: Every day | ORAL | 1 refills | Status: DC

## 2024-04-01 NOTE — Telephone Encounter (Signed)
 Patient notified of each result, provider comments and recommendations. He was advised of new prescription for fenofibrate and referral to GI

## 2024-04-01 NOTE — Telephone Encounter (Signed)
 Called patient but no answer, left voice mail for patient to call back.

## 2024-04-01 NOTE — Telephone Encounter (Signed)
 Liver function test remains elevated, likely due to fatty liver.  Triglycerides are very high.  Continue to work on a reduced sugar, reduced carb diet. I would also like for him to add fenofibrate once daily for his triglycerides.   Sugar remains stable in the borderline diabetes range.  Lipase, pancreatic enzyme is still elevated.  I am not sure why, but I would like for him to see the GI doctor for this.

## 2024-04-08 ENCOUNTER — Encounter: Payer: Self-pay | Admitting: Family

## 2024-04-28 ENCOUNTER — Encounter: Payer: Self-pay | Admitting: Family

## 2024-06-07 ENCOUNTER — Other Ambulatory Visit: Payer: Self-pay | Admitting: Family

## 2024-06-07 DIAGNOSIS — I10 Essential (primary) hypertension: Secondary | ICD-10-CM

## 2024-08-03 ENCOUNTER — Telehealth: Admitting: Family

## 2024-08-03 VITALS — BP 145/84 | HR 66 | Ht 71.0 in | Wt 210.0 lb

## 2024-08-03 DIAGNOSIS — E785 Hyperlipidemia, unspecified: Secondary | ICD-10-CM | POA: Diagnosis not present

## 2024-08-03 DIAGNOSIS — I10 Essential (primary) hypertension: Secondary | ICD-10-CM

## 2024-08-03 MED ORDER — FENOFIBRATE 145 MG PO TABS: 145.0000 mg | ORAL_TABLET | Freq: Every day | ORAL | 1 refills | Status: DC

## 2024-08-03 MED ORDER — ROSUVASTATIN CALCIUM 20 MG PO TABS
20.0000 mg | ORAL_TABLET | Freq: Every day | ORAL | 0 refills | Status: DC
Start: 2024-08-03 — End: 2024-08-27

## 2024-08-03 MED ORDER — METOPROLOL SUCCINATE ER 50 MG PO TB24: 75.0000 mg | ORAL_TABLET | Freq: Every day | ORAL | 1 refills | Status: DC

## 2024-08-03 NOTE — Assessment & Plan Note (Signed)
 Lab Results  Component Value Date   CHOL 132 03/31/2024   HDL 32.90 (L) 03/31/2024   LDLCALC 63 03/24/2023   LDLDIRECT 76.0 03/31/2024   TRIG (H) 03/31/2024    408.0 Triglyceride is over 400; calculations on Lipids are invalid.   CHOLHDL 4 03/31/2024  Pt wishes to change atorvastatin  to crestor . Will rx crestor  20 mg. Continue fenofibrate , plan fasting visit in 1 month for FLP and bp recheck.

## 2024-08-03 NOTE — Assessment & Plan Note (Addendum)
 BP elevated here in the office. He states usually better.  He plans to repeat bp again later this evening when he is relaxed and will let me know his reading via MyChart. Continue toprol  xl 50mg  and valsartan .   Addendum: repeat BP was 118/78 at home. No med changes at this time.

## 2024-08-03 NOTE — Progress Notes (Unsigned)
 MyChart Video Visit    Virtual Visit via Video Note    Patient location: Home. Patient and provider in visit Provider location: Office  I discussed the limitations of evaluation and management by telemedicine and the availability of in person appointments. The patient expressed understanding and agreed to proceed.  Visit Date: 08/03/2024  Today's healthcare provider: Eleanor GORMAN Ponto, NP     Subjective:    Patient ID: [name] [name], male    DOB: 1976-01-07, 48 y.o.   MRN: [id number]  Chief Complaint  Patient presents with   Medication Management    Would like to change cholesterol medications    HPI  [name] [name] is a 48 year old male with hyperlipidemia and hypertension who presents for medication management.  He is currently taking Lipitor 20 mg for hyperlipidemia. His last cholesterol reading showed LDL cholesterol at target levels, but triglycerides were elevated. He also takes fenofibrate  for triglyceride management and is on the last refill. He is interested in changing his lipitor to crestor  as he has heard crestor  is better in Asians.  He manages hypertension with valsartan  and metoprolol . His blood pressure was good during his last visit in April but is slightly elevated today. He is on the last refill of metoprolol  and requests a refill.  Past Medical History:  Diagnosis Date   Abnormal LFTs (liver function tests)    secondary to lovastatin and fatty liver   Calcaneal fracture 07/31/2017   left   Fatty liver    history of fatty liver   Hyperglycemia    Hyperlipidemia    Hypertension     No past surgical history on file.  Family History  Problem Relation Age of Onset   Diabetes Mother    Hypertension Mother    Hypertension Father    Stroke Maternal Grandfather    Coronary artery disease Maternal Uncle    Other Neg Hx        no FH of colon cancer    Social History   Socioeconomic History   Marital status: Married    Spouse name: Not on  file   Number of children: Not on file   Years of education: Not on file   Highest education level: Bachelor's degree (e.g., [employer], [employer], BS)  Occupational History   Not on file  Tobacco Use   Smoking status: Never   Smokeless tobacco: Never  Substance and Sexual Activity   Alcohol use: No    Alcohol/week: 0.0 standard drinks of alcohol   Drug use: No   Sexual activity: Yes    Partners: Female  Other Topics Concern   Not on file  Social History Narrative   Married - wife is NP no children   Never Smoked   Alcohol use-no    Drug use-no    Regular exercise-yes     Occupation:  Works as a Magazine features editor for the state      Social Drivers of Corporate investment banker Strain: Low Risk  (07/31/2024)   Overall Financial Resource Strain (CARDIA)    Difficulty of Paying Living Expenses: Not hard at all  Food Insecurity: No Food Insecurity (07/31/2024)   Hunger Vital Sign    Worried About Running Out of Food in the Last Year: Never true    Ran Out of Food in the Last Year: Never true  Transportation Needs: No Transportation Needs (07/31/2024)   PRAPARE - Administrator, Civil Service (Medical): No  Lack of Transportation (Non-Medical): No  Physical Activity: Sufficiently Active (07/31/2024)   Exercise Vital Sign    Days of Exercise per Week: 7 days    Minutes of Exercise per Session: 30 min  Stress: No Stress Concern Present (07/31/2024)   [employer] of Occupational Health - Occupational Stress Questionnaire    Feeling of Stress: Not at all  Social Connections: Unknown (07/31/2024)   Social Connection and Isolation Panel    Frequency of Communication with Friends and Family: Once a week    Frequency of Social Gatherings with Friends and Family: Once a week    Attends Religious Services: More than 4 times per year    Active Member of [employer] or Organizations: Patient declined    Attends Engineer, structural: Not on file    Marital Status: Married  Catering manager  Violence: Not on file    Outpatient Medications Prior to Visit  Medication Sig Dispense Refill   albuterol  (PROAIR  HFA) 108 (90 Base) MCG/ACT inhaler Inhale 2 puffs into the lungs every 6 (six) hours as needed for wheezing or shortness of breath. 6.7 g 5   MAGNESIUM  PO Take 1 tablet by mouth daily.     Omega-3 Fatty Acids (FISH OIL) 1000 MG CAPS Take 2,000 mg by mouth daily.     pantoprazole  (PROTONIX ) 40 MG tablet Take 1 tablet (40 mg total) by mouth daily as needed.     valsartan  (DIOVAN ) 320 MG tablet TAKE 1 TABLET BY MOUTH DAILY 90 tablet 1   Vitamin D-Vitamin K (VITAMIN K2-VITAMIN D3 PO) Take 1 tablet by mouth daily.     atorvastatin  (LIPITOR) 20 MG tablet Take 1 tablet (20 mg total) by mouth daily. 90 tablet 1   fenofibrate  (TRICOR ) 145 MG tablet Take 1 tablet (145 mg total) by mouth daily. 90 tablet 1   metoprolol  succinate (TOPROL -XL) 50 MG 24 hr tablet Take 1.5 tablets (75 mg total) by mouth daily. Take with or immediately following a meal. 145 tablet 1   No facility-administered medications prior to visit.    No Known Allergies  ROS See HPI    Objective:    Physical Exam Constitutional:      Appearance: Normal appearance.  Pulmonary:     Effort: Pulmonary effort is normal.  Neurological:     Mental Status: He is alert and oriented to person, place, and time.  Psychiatric:        Mood and Affect: Mood normal.        Behavior: Behavior normal.        Thought Content: Thought content normal.        Judgment: Judgment normal.     BP 118/78 Comment: repeated 9 PM at home  Pulse 66   Ht 5' 11 (1.803 m)   Wt 210 lb (95.3 kg)   BMI 29.29 kg/m  Wt Readings from Last 3 Encounters:  08/03/24 210 lb (95.3 kg)  03/31/24 221 lb (100.2 kg)  09/16/23 221 lb (100.2 kg)       Assessment & Plan:   Problem List Items Addressed This Visit       Unprioritized   Hyperlipidemia   Lab Results  Component Value Date   CHOL 132 03/31/2024   HDL 32.90 (L) 03/31/2024    LDLCALC 63 03/24/2023   LDLDIRECT 76.0 03/31/2024   TRIG (H) 03/31/2024    408.0 Triglyceride is over 400; calculations on Lipids are invalid.   CHOLHDL 4 03/31/2024  Pt wishes to change atorvastatin   to crestor . Will rx crestor  20 mg. Continue fenofibrate , plan fasting visit in 1 month for FLP and bp recheck.        Relevant Medications   rosuvastatin  (CRESTOR ) 20 MG tablet   metoprolol  succinate (TOPROL -XL) 50 MG 24 hr tablet   fenofibrate  (TRICOR ) 145 MG tablet   HTN (hypertension) - Primary   BP elevated here in the office. He states usually better.  He plans to repeat bp again later this evening when he is relaxed and will let me know his reading via MyChart. Continue toprol  xl 50mg  and valsartan .   Addendum: repeat BP was 118/78 at home. No med changes at this time.      Relevant Medications   rosuvastatin  (CRESTOR ) 20 MG tablet   metoprolol  succinate (TOPROL -XL) 50 MG 24 hr tablet   fenofibrate  (TRICOR ) 145 MG tablet    I have discontinued [name] [name]'s atorvastatin . I am also having him start on rosuvastatin . Additionally, I am having him maintain his Fish Oil, albuterol , pantoprazole , valsartan , MAGNESIUM  PO, Vitamin D-Vitamin K (VITAMIN K2-VITAMIN D3 PO), metoprolol  succinate, and fenofibrate .  Meds ordered this encounter  Medications   rosuvastatin  (CRESTOR ) 20 MG tablet    Sig: Take 1 tablet (20 mg total) by mouth daily.    Dispense:  90 tablet    Refill:  0    Supervising Provider:   DOMENICA BLACKBIRD A [[id number]]   metoprolol  succinate (TOPROL -XL) 50 MG 24 hr tablet    Sig: Take 1.5 tablets (75 mg total) by mouth daily. Take with or immediately following a meal.    Dispense:  145 tablet    Refill:  1    Supervising Provider:   DOMENICA BLACKBIRD A [[id number]]   fenofibrate  (TRICOR ) 145 MG tablet    Sig: Take 1 tablet (145 mg total) by mouth daily.    Dispense:  90 tablet    Refill:  1    Supervising Provider:   DOMENICA BLACKBIRD A [[id number]]    I discussed the assessment and  treatment plan with the patient. The patient was provided an opportunity to ask questions and all were answered. The patient agreed with the plan and demonstrated an understanding of the instructions.   The patient was advised to call back or seek an in-person evaluation if the symptoms worsen or if the condition fails to improve as anticipated.   Eleanor GORMAN Ponto, NP [place] [facility] [phone] (phone) [phone] (fax)  [facility]

## 2024-08-04 ENCOUNTER — Encounter: Payer: Self-pay | Admitting: Family

## 2024-08-04 NOTE — Patient Instructions (Signed)
 VISIT SUMMARY:  You came in today for medication management of your high cholesterol and high blood pressure. We made some changes to your medications to better manage your conditions.  YOUR PLAN:  HYPERLIPIDEMIA: Your cholesterol levels are being managed, but your triglycerides are still high. -Stop taking Lipitor. -Start taking Crestor  20 mg. Your prescription has been sent to [facility]. -Schedule a follow-up in one month to check your cholesterol levels on Crestor . -Consider a fasting visit to assess triglycerides.  HYPERTENSION: Your blood pressure is slightly elevated today but usually well-controlled. -Monitor your blood pressure at home and send the results via MyChart. -Your prescriptions for valsartan  and metoprolol  have been refilled. -We will recheck your blood pressure at your follow-up visit in one month.

## 2024-08-27 ENCOUNTER — Ambulatory Visit: Admitting: Family

## 2024-08-27 ENCOUNTER — Encounter: Payer: Self-pay | Admitting: Family

## 2024-08-27 VITALS — BP 119/75 | HR 59 | Temp 97.7°F | Resp 16 | Ht 71.0 in | Wt 216.0 lb

## 2024-08-27 DIAGNOSIS — R7402 Elevation of levels of lactic acid dehydrogenase (LDH): Secondary | ICD-10-CM

## 2024-08-27 DIAGNOSIS — R7401 Elevation of levels of liver transaminase levels: Secondary | ICD-10-CM

## 2024-08-27 DIAGNOSIS — R739 Hyperglycemia, unspecified: Secondary | ICD-10-CM | POA: Diagnosis not present

## 2024-08-27 DIAGNOSIS — K219 Gastro-esophageal reflux disease without esophagitis: Secondary | ICD-10-CM

## 2024-08-27 DIAGNOSIS — E785 Hyperlipidemia, unspecified: Secondary | ICD-10-CM | POA: Diagnosis not present

## 2024-08-27 DIAGNOSIS — J45909 Unspecified asthma, uncomplicated: Secondary | ICD-10-CM

## 2024-08-27 DIAGNOSIS — K861 Other chronic pancreatitis: Secondary | ICD-10-CM

## 2024-08-27 DIAGNOSIS — R748 Abnormal levels of other serum enzymes: Secondary | ICD-10-CM | POA: Diagnosis not present

## 2024-08-27 DIAGNOSIS — I10 Essential (primary) hypertension: Secondary | ICD-10-CM

## 2024-08-27 LAB — LIPID PANEL
Cholesterol: 91 mg/dL (ref 0–200)
HDL: 31.4 mg/dL — ABNORMAL LOW (ref >39.00)
LDL Cholesterol: 44 mg/dL (ref 0–99)
NonHDL: 59.2
Total CHOL/HDL Ratio: 3
Triglycerides: 78 mg/dL (ref 0.0–149.0)
VLDL: 15.6 mg/dL (ref 0.0–40.0)

## 2024-08-27 LAB — HEMOGLOBIN A1C: Hgb A1c MFr Bld: 6.3 % (ref 4.6–6.5)

## 2024-08-27 LAB — LIPASE: Lipase: 258 U/L — ABNORMAL HIGH (ref 11.0–59.0)

## 2024-08-27 LAB — COMPREHENSIVE METABOLIC PANEL WITH GFR
ALT: 65 U/L — ABNORMAL HIGH (ref 0–53)
AST: 41 U/L — ABNORMAL HIGH (ref 0–37)
Albumin: 4.8 g/dL (ref 3.5–5.2)
Alkaline Phosphatase: 37 U/L — ABNORMAL LOW (ref 39–117)
BUN: 16 mg/dL (ref 6–23)
CO2: 29 meq/L (ref 19–32)
Calcium: 9.5 mg/dL (ref 8.4–10.5)
Chloride: 105 meq/L (ref 96–112)
Creatinine, Ser: 1.08 mg/dL (ref 0.40–1.50)
GFR: 80.65 mL/min (ref >60.00)
Glucose, Bld: 112 mg/dL — ABNORMAL HIGH (ref 70–99)
Potassium: 4 meq/L (ref 3.5–5.1)
Sodium: 145 meq/L (ref 135–145)
Total Bilirubin: 0.8 mg/dL (ref 0.2–1.2)
Total Protein: 7.3 g/dL (ref 6.0–8.3)

## 2024-08-27 MED ORDER — PANTOPRAZOLE SODIUM 40 MG PO TBEC: 40.0000 mg | DELAYED_RELEASE_TABLET | Freq: Every day | ORAL | 1 refills | Status: AC | PRN

## 2024-08-27 MED ORDER — ALBUTEROL SULFATE HFA 108 (90 BASE) MCG/ACT IN AERS: 2.0000 | INHALATION_SPRAY | Freq: Four times a day (QID) | RESPIRATORY_TRACT | 5 refills | Status: AC | PRN

## 2024-08-27 MED ORDER — ROSUVASTATIN CALCIUM 20 MG PO TABS: 20.0000 mg | ORAL_TABLET | Freq: Every day | ORAL | 0 refills | Status: DC

## 2024-08-27 NOTE — Assessment & Plan Note (Signed)
 Likely due to fatty liver. Encouraged that he continue his work on [employer], exercise and weight loss.

## 2024-08-27 NOTE — Assessment & Plan Note (Signed)
 BP Readings from Last 3 Encounters:  08/27/24 119/75  08/04/24 118/78  03/31/24 128/74   BP stable on diovan  and metoprolol . Continue same.

## 2024-08-27 NOTE — Assessment & Plan Note (Signed)
 Sleeps with head of bed up.  Uses pantoprazole  prn.

## 2024-08-27 NOTE — Patient Instructions (Signed)
 VISIT SUMMARY:  You came in for a follow-up on your blood pressure management and liver function tests. Your blood pressure is well-controlled, and your asthma remains stable. We discussed your liver function, prediabetes, and occasional heartburn. You have also made progress with weight loss.  YOUR PLAN:  ELEVATED LIVER ENZYMES AND ELEVATED LIPASE: Your liver enzymes and lipase levels have been elevated, likely due to fatty liver and possibly high triglycerides. -We will order liver function tests and lipase levels. -If lipase remains elevated, we may refer you to a gastrointestinal specialist. -We may consider repeat abdominal imaging based on your lab results.  PREDIABETES: Your A1c levels are borderline, indicating prediabetes. -We will order an A1c test. -Continue with your dietary modifications to limit starches and sugars.  HYPERTENSION: Your blood pressure is well-controlled with your current medications. -Continue taking metoprolol  XL 50 mg daily. -Continue taking valsartan  320 mg daily.  ASTHMA: Your asthma is well-controlled with occasional use of your albuterol  inhaler. -We will refill your albuterol  inhaler.  GASTROESOPHAGEAL REFLUX DISEASE (GERD): Your heartburn is managed with pantoprazole  and lifestyle changes. -We will refill your pantoprazole  as needed. -Continue with your lifestyle modifications.  OVERWEIGHT: You have lost weight and are engaging in regular physical activity. -Continue your current exercise regimen. -Keep up with your weight management efforts.  GENERAL HEALTH MAINTENANCE: We discussed your vaccinations and upcoming preventive measures. -We will discuss hepatitis B and pneumonia vaccines at your next visit. -Get your flu shot and COVID booster in the fall at the pharmacy.

## 2024-08-27 NOTE — Assessment & Plan Note (Signed)
 Has been chronic. Could be secondary to his hypertriglyceridemia. Last visit we referred him to GI. He did not schedule consult. He would like to repeat lipase today and if still elevated he will likely follow through with referral.

## 2024-08-27 NOTE — Assessment & Plan Note (Signed)
 Update lipid panel, continue crestor /fenofibrate .

## 2024-08-27 NOTE — Assessment & Plan Note (Signed)
 Lab Results  Component Value Date   HGBA1C 6.1 03/31/2024   Borderline DM. Continue diet/exercise/weight loss.

## 2024-08-27 NOTE — Progress Notes (Signed)
 Subjective:     Patient ID: [name] [name], male    DOB: Jul 03, 1976, 48 y.o.   MRN: [id number]  Chief Complaint  Patient presents with   Hypertension    Here for follow up    Hypertension    Discussed the use of AI scribe software for clinical note transcription with the patient, who gave verbal consent to proceed.  History of Present Illness  [name] [name] is a 48 year old male with hypertension and asthma who presents for follow-up on blood pressure management and liver function tests.  Blood pressure is now within normal range, with today's reading at 119/75 mmHg. He continues on metoprolol  XL 50 mg and valsartan  320 mg daily without changes.  Liver function tests have been elevated previously, with a past ultrasound showing fatty liver. No recent abdominal imaging has been done. Lipase levels have also been elevated.  Asthma remains stable with no recent wheezing. He uses an albuterol  inhaler as needed and requests a refill.  He has lost weight, from 221 lbs in April to 216 lbs, by reducing starch and sugar intake and engaging in regular physical activity.  Occasional heartburn is managed with pantoprazole  and lifestyle modifications. He requests a refill for pantoprazole .  He plans to schedule a colonoscopy- he has not yet scheduled but has been contacted by the GI team. He reports no family history of colorectal cancer.  Declines Hep B and Prevnar.     Health Maintenance Due  Topic Date Due   Pneumococcal Vaccine (1 of 2 - PCV) Never done   Hepatitis B Vaccines 19-59 Average Risk (1 of 3 - 19+ 3-dose series) Never done   Colonoscopy  Never done   COVID-19 Vaccine (4 - 2024-25 season) 08/31/2023   INFLUENZA VACCINE  07/30/2024    Past Medical History:  Diagnosis Date   Abnormal LFTs (liver function tests)    secondary to lovastatin and fatty liver   Calcaneal fracture 07/31/2017   left   Fatty liver    history of fatty liver   Hyperglycemia    Hyperlipidemia     Hypertension     No past surgical history on file.  Family History  Problem Relation Age of Onset   Diabetes Mother    Hypertension Mother    Hypertension Father    Stroke Maternal Grandfather    Coronary artery disease Maternal Uncle    Other Neg Hx        no FH of colon cancer    Social History   Socioeconomic History   Marital status: Married    Spouse name: Not on file   Number of children: Not on file   Years of education: Not on file   Highest education level: Bachelor's degree (e.g., [employer], [employer], BS)  Occupational History   Not on file  Tobacco Use   Smoking status: Never   Smokeless tobacco: Never  Substance and Sexual Activity   Alcohol use: No    Alcohol/week: 0.0 standard drinks of alcohol   Drug use: No   Sexual activity: Yes    Partners: Female  Other Topics Concern   Not on file  Social History Narrative   Married - wife is NP no children   Never Smoked   Alcohol use-no    Drug use-no    Regular exercise-yes     Occupation:  Works as a Magazine features editor for the state      Social Drivers of Corporate investment banker Strain:  Low Risk  (07/31/2024)   Overall Financial Resource Strain (CARDIA)    Difficulty of Paying Living Expenses: Not hard at all  Food Insecurity: No Food Insecurity (07/31/2024)   Hunger Vital Sign    Worried About Running Out of Food in the Last Year: Never true    Ran Out of Food in the Last Year: Never true  Transportation Needs: No Transportation Needs (07/31/2024)   PRAPARE - Administrator, Civil Service (Medical): No    Lack of Transportation (Non-Medical): No  Physical Activity: Sufficiently Active (07/31/2024)   Exercise Vital Sign    Days of Exercise per Week: 7 days    Minutes of Exercise per Session: 30 min  Stress: No Stress Concern Present (07/31/2024)   [employer] of Occupational Health - Occupational Stress Questionnaire    Feeling of Stress: Not at all  Social Connections: Unknown (07/31/2024)   Social  Connection and Isolation Panel    Frequency of Communication with Friends and Family: Once a week    Frequency of Social Gatherings with Friends and Family: Once a week    Attends Religious Services: More than 4 times per year    Active Member of [employer] or Organizations: Patient declined    Attends Engineer, structural: Not on file    Marital Status: Married  Catering manager Violence: Not on file    Outpatient Medications Prior to Visit  Medication Sig Dispense Refill   fenofibrate  (TRICOR ) 145 MG tablet Take 1 tablet (145 mg total) by mouth daily. 90 tablet 1   MAGNESIUM  PO Take 1 tablet by mouth daily.     metoprolol  succinate (TOPROL -XL) 50 MG 24 hr tablet Take 1.5 tablets (75 mg total) by mouth daily. Take with or immediately following a meal. 145 tablet 1   Omega-3 Fatty Acids (FISH OIL) 1000 MG CAPS Take 2,000 mg by mouth daily.     valsartan  (DIOVAN ) 320 MG tablet TAKE 1 TABLET BY MOUTH DAILY 90 tablet 1   Vitamin D-Vitamin K (VITAMIN K2-VITAMIN D3 PO) Take 1 tablet by mouth daily.     albuterol  (PROAIR  HFA) 108 (90 Base) MCG/ACT inhaler Inhale 2 puffs into the lungs every 6 (six) hours as needed for wheezing or shortness of breath. 6.7 g 5   pantoprazole  (PROTONIX ) 40 MG tablet Take 1 tablet (40 mg total) by mouth daily as needed.     rosuvastatin  (CRESTOR ) 20 MG tablet Take 1 tablet (20 mg total) by mouth daily. 90 tablet 0   No facility-administered medications prior to visit.    No Known Allergies  ROS See HPI    Objective:    Physical Exam Constitutional:      General: He is not in acute distress.    Appearance: He is well-developed.  HENT:     Head: Normocephalic and atraumatic.  Cardiovascular:     Rate and Rhythm: Normal rate and regular rhythm.     Heart sounds: No murmur heard. Pulmonary:     Effort: Pulmonary effort is normal. No respiratory distress.     Breath sounds: Normal breath sounds. No wheezing or rales.  Skin:    General: Skin is warm  and dry.  Neurological:     Mental Status: He is alert and oriented to person, place, and time.  Psychiatric:        Behavior: Behavior normal.        Thought Content: Thought content normal.      BP 119/75 (BP Location: Right  Arm, Patient Position: Sitting, Cuff Size: Large)   Pulse (!) 59   Temp 97.7 F (36.5 C) (Oral)   Resp 16   Ht 5' 11 (1.803 m)   Wt 216 lb (98 kg)   SpO2 98%   BMI 30.13 kg/m  Wt Readings from Last 3 Encounters:  08/27/24 216 lb (98 kg)  08/03/24 210 lb (95.3 kg)  03/31/24 221 lb (100.2 kg)       Assessment & Plan:   Problem List Items Addressed This Visit       Unprioritized   TRANSAMINASES, SERUM, ELEVATED   Likely due to fatty liver. Encouraged that he continue his work on [employer], exercise and weight loss.      Hyperlipidemia   Update lipid panel, continue crestor /fenofibrate .      Relevant Medications   rosuvastatin  (CRESTOR ) 20 MG tablet   Other Relevant Orders   Comp Met (CMET)   Lipid panel   Hyperglycemia   Lab Results  Component Value Date   HGBA1C 6.1 03/31/2024   Borderline DM. Continue diet/exercise/weight loss.       Relevant Orders   HgB A1c   Comp Met (CMET)   HTN (hypertension)   BP Readings from Last 3 Encounters:  08/27/24 119/75  08/04/24 118/78  03/31/24 128/74   BP stable on diovan  and metoprolol . Continue same.       Relevant Medications   rosuvastatin  (CRESTOR ) 20 MG tablet   Gastroesophageal reflux disease   Sleeps with head of bed up.  Uses pantoprazole  prn.       Relevant Medications   pantoprazole  (PROTONIX ) 40 MG tablet   Elevated lipase - Primary   Has been chronic. Could be secondary to his hypertriglyceridemia. Last visit we referred him to GI. He did not schedule consult. He would like to repeat lipase today and if still elevated he will likely follow through with referral.       Relevant Orders   Lipase   Asthma   Stable. Requests refill of albuterol  for prn use.        Relevant Medications   albuterol  (PROAIR  HFA) 108 (90 Base) MCG/ACT inhaler    I am having Azure TSABRA Miyamoto maintain his Fish Oil, valsartan , MAGNESIUM  PO, Vitamin D-Vitamin K (VITAMIN K2-VITAMIN D3 PO), metoprolol  succinate, fenofibrate , rosuvastatin , pantoprazole , and albuterol .  Meds ordered this encounter  Medications   rosuvastatin  (CRESTOR ) 20 MG tablet    Sig: Take 1 tablet (20 mg total) by mouth daily.    Dispense:  90 tablet    Refill:  0    Supervising Provider:   DOMENICA BLACKBIRD A [[id number]]   pantoprazole  (PROTONIX ) 40 MG tablet    Sig: Take 1 tablet (40 mg total) by mouth daily as needed.    Dispense:  90 tablet    Refill:  1    Supervising Provider:   DOMENICA BLACKBIRD A [[id number]]   albuterol  (PROAIR  HFA) 108 (90 Base) MCG/ACT inhaler    Sig: Inhale 2 puffs into the lungs every 6 (six) hours as needed for wheezing or shortness of breath.    Dispense:  6.7 g    Refill:  5    Supervising Provider:   DOMENICA BLACKBIRD A [[id number]]

## 2024-08-27 NOTE — Assessment & Plan Note (Signed)
 Stable. Requests refill of albuterol  for prn use.

## 2024-08-28 ENCOUNTER — Ambulatory Visit: Payer: Self-pay | Admitting: Family

## 2024-08-28 DIAGNOSIS — K859 Acute pancreatitis without necrosis or infection, unspecified: Secondary | ICD-10-CM

## 2024-08-28 NOTE — Telephone Encounter (Signed)
 Triglycerides are much better.   Lipase (pancreatitis test) is very high. I would like for him to complete a CT of his abdomen so I can further evaluate his pancreas and fatty liver.   I would also like him to see GI. I am going to put in a fresh referral for him.    A1C up to 6.3 form 6.1.   Continue working on dietary changes, exercise, weight loss and avoiding alcohol.

## 2024-08-31 ENCOUNTER — Encounter: Payer: Self-pay | Admitting: Physician Assistant

## 2024-09-01 NOTE — Telephone Encounter (Signed)
 Called patient but no answer, left voice mail for patient to call back.

## 2024-09-03 ENCOUNTER — Telehealth (HOSPITAL_BASED_OUTPATIENT_CLINIC_OR_DEPARTMENT_OTHER): Payer: Self-pay

## 2024-09-06 NOTE — Progress Notes (Signed)
 Called patient again, still no answer. Left voice mail with his emergency contact for him to call us  back

## 2024-09-20 ENCOUNTER — Ambulatory Visit (HOSPITAL_BASED_OUTPATIENT_CLINIC_OR_DEPARTMENT_OTHER)

## 2024-10-15 ENCOUNTER — Other Ambulatory Visit (INDEPENDENT_AMBULATORY_CARE_PROVIDER_SITE_OTHER)

## 2024-10-15 ENCOUNTER — Ambulatory Visit: Payer: Self-pay | Admitting: Family

## 2024-10-15 DIAGNOSIS — K861 Other chronic pancreatitis: Secondary | ICD-10-CM | POA: Diagnosis not present

## 2024-10-15 LAB — HEPATIC FUNCTION PANEL
ALT: 72 U/L — ABNORMAL HIGH (ref 0–53)
AST: 35 U/L (ref 0–37)
Albumin: 4.6 g/dL (ref 3.5–5.2)
Alkaline Phosphatase: 44 U/L (ref 39–117)
Bilirubin, Direct: 0.2 mg/dL (ref 0.0–0.3)
Total Bilirubin: 0.8 mg/dL (ref 0.2–1.2)
Total Protein: 7 g/dL (ref 6.0–8.3)

## 2024-10-15 LAB — LIPASE: Lipase: 66 U/L — ABNORMAL HIGH (ref 11.0–59.0)

## 2024-10-19 ENCOUNTER — Ambulatory Visit: Admitting: Physician Assistant

## 2024-10-26 ENCOUNTER — Other Ambulatory Visit: Payer: Self-pay | Admitting: Family

## 2024-10-26 DIAGNOSIS — I10 Essential (primary) hypertension: Secondary | ICD-10-CM

## 2024-10-27 ENCOUNTER — Other Ambulatory Visit: Payer: Self-pay

## 2024-10-27 ENCOUNTER — Other Ambulatory Visit

## 2024-10-27 ENCOUNTER — Ambulatory Visit: Payer: Self-pay | Admitting: Family

## 2024-10-27 ENCOUNTER — Ambulatory Visit: Admitting: Family

## 2024-10-27 DIAGNOSIS — E785 Hyperlipidemia, unspecified: Secondary | ICD-10-CM | POA: Diagnosis not present

## 2024-10-27 DIAGNOSIS — I10 Essential (primary) hypertension: Secondary | ICD-10-CM

## 2024-10-27 LAB — HEPATIC FUNCTION PANEL
ALT: 77 U/L — ABNORMAL HIGH (ref 0–53)
AST: 35 U/L (ref 0–37)
Albumin: 4.7 g/dL (ref 3.5–5.2)
Alkaline Phosphatase: 47 U/L (ref 39–117)
Bilirubin, Direct: 0.3 mg/dL (ref 0.0–0.3)
Total Bilirubin: 1.1 mg/dL (ref 0.2–1.2)
Total Protein: 7.2 g/dL (ref 6.0–8.3)

## 2024-10-27 LAB — LIPID PANEL
Cholesterol: 105 mg/dL (ref 0–200)
HDL: 34.3 mg/dL — ABNORMAL LOW (ref >39.00)
LDL Cholesterol: 42 mg/dL (ref 0–99)
NonHDL: 70.46
Total CHOL/HDL Ratio: 3
Triglycerides: 144 mg/dL (ref 0.0–149.0)
VLDL: 28.8 mg/dL (ref 0.0–40.0)

## 2024-10-27 MED ORDER — ROSUVASTATIN CALCIUM 20 MG PO TABS: 20.0000 mg | ORAL_TABLET | Freq: Every day | ORAL | 1 refills | Status: AC

## 2024-11-04 ENCOUNTER — Encounter: Payer: Self-pay | Admitting: Family

## 2024-11-04 DIAGNOSIS — I10 Essential (primary) hypertension: Secondary | ICD-10-CM

## 2024-11-05 MED ORDER — METOPROLOL SUCCINATE ER 50 MG PO TB24: 75.0000 mg | ORAL_TABLET | Freq: Every day | ORAL | 1 refills | Status: AC

## 2024-11-23 NOTE — Progress Notes (Addendum)
 "  [name] [id number] [birth year]   Chief Complaint: Elevated lipase  Referring Provider: Daryl Setter, NP Primary GI MD: Dr. Avram  HPI: [name] [name] is a 48 y.o. male with past medical history of fatty liver, elevated liver enzymes, hyperglycemia, HLD, HTN who presents today for evaluation of elevated lipase.    Last seen in office 05/16/2016 by Dr. Avram for history of suspected fatty liver.  Wife is a publishing rights manager.  Had mild abnormalities of transaminases detected at work physical.  No alcohol use reported.  Had negative screening for celiac disease. This appears to have been a longstanding problem going back to 2006 at least.  Has had persistent abnormal transaminases 1.5-3 times upper limit of normal.  CT scan in 2006 demonstrated low-attenuation throughout the liver and in the context of his hyperlipidemia and other test results has been thought that he has likely fatty liver disease.  Has not been proven with biopsy. Liver biopsy was discussed at last visit, as well as reaction to statin therapy.  Plan was for additional laboratory testing, possible consult at [facility] liver center.  Seen by PCP 08/27/2024.  Has had a chronic elevation in lipase.  Thought to be secondary to his hypertriglyceridemia.  Per note, had been referred to GI at last visit but did not schedule.  Opted to repeat a lipase and follow-up with GI if it remained elevated.  Lipase did come back elevated to 258 and CT A/P was ordered to evaluate the pancreas and liver. On repeat testing 1 month ago was 66.  Most recent hepatic function panel with ALT of 77 and otherwise normal.  CT A/P has not been completed.   Discussed the use of AI scribe software for clinical note transcription with the patient, who gave verbal consent to proceed.  History of Present Illness [name] [name] is a 48 year old male who presents for evaluation of elevated lipase levels.  Elevated lipase levels - Recent elevation in  lipase levels (258) after starting fenofibrate   - Lipase levels decreased to 66 after discontinuing fenofibrate  - Associates initial abdominal pain and elevated lipase with a kidney stone episode last year requiring ER visit (08/2023) - Had CT at that time without evidence of pancreatitis - No current abdominal pain, nausea, vomiting, heartburn, or acid reflux  Hepatic enzyme abnormalities and hepatic steatosis - Elevated liver enzymes and fatty liver, possibly associated with statin use - Ultrasound revealed fatty liver - Extensive workup excluded other causes without definitive etiology  Gastrointestinal symptoms - Bowel movements regular, two to three times daily - No blood or black stools  Pancreatic and family history - No history of pancreatitis - No family history of pancreatic problems  Lifestyle factors - No alcohol or tobacco use - Engages in regular exercise   Previous GI Procedures/Imaging      Past Medical History:  Diagnosis Date   Abnormal LFTs (liver function tests)    secondary to lovastatin and fatty liver   Calcaneal fracture 07/31/2017   left   Fatty liver    history of fatty liver   Hyperglycemia    Hyperlipidemia    Hypertension     No past surgical history on file.  Current Outpatient Medications  Medication Sig Dispense Refill   albuterol  (PROAIR  HFA) 108 (90 Base) MCG/ACT inhaler Inhale 2 puffs into the lungs every 6 (six) hours as needed for wheezing or shortness of breath. 6.7 g 5   MAGNESIUM  PO Take 1 tablet by mouth  daily.     metoprolol  succinate (TOPROL -XL) 50 MG 24 hr tablet Take 1.5 tablets (75 mg total) by mouth daily. Take with or immediately following a meal. 135 tablet 1   Omega-3 Fatty Acids (FISH OIL) 1000 MG CAPS Take 2,000 mg by mouth daily.     pantoprazole  (PROTONIX ) 40 MG tablet Take 1 tablet (40 mg total) by mouth daily as needed. 90 tablet 1   rosuvastatin  (CRESTOR ) 20 MG tablet Take 1 tablet (20 mg total) by mouth daily.  90 tablet 1   valsartan  (DIOVAN ) 320 MG tablet TAKE 1 TABLET BY MOUTH DAILY 90 tablet 1   Vitamin D-Vitamin K (VITAMIN K2-VITAMIN D3 PO) Take 1 tablet by mouth daily.     No current facility-administered medications for this visit.    Allergies as of 11/24/2024   (No Known Allergies)    Family History  Problem Relation Age of Onset   Diabetes Mother    Hypertension Mother    Hypertension Father    Stroke Maternal Grandfather    Coronary artery disease Maternal Uncle    Other Neg Hx        no FH of colon cancer    Social History   Tobacco Use   Smoking status: Never   Smokeless tobacco: Never  Substance Use Topics   Alcohol use: No    Alcohol/week: 0.0 standard drinks of alcohol   Drug use: No     Review of Systems:    Constitutional: No unintentional weight loss, fever, chills Cardiovascular: No chest pain Respiratory: No SOB  Gastrointestinal: See HPI and otherwise negative   Physical Exam:  Vital signs: BP 118/78   Pulse 68   Ht 5' 10 (1.778 m)   Wt 215 lb (97.5 kg)   SpO2 98%   BMI 30.85 kg/m   Constitutional: Pleasant, overweight male in NAD, alert and cooperative Head:  Normocephalic and atraumatic.  Eyes: No scleral icterus.  Respiratory: Respirations even and unlabored. Lungs clear to auscultation bilaterally.  No wheezes, crackles, or rhonchi.  Cardiovascular:  Regular rate and rhythm. No murmurs. No peripheral edema. Gastrointestinal:  Soft, nondistended, nontender. No rebound or guarding. Normal bowel sounds. No appreciable masses or hepatomegaly. Rectal:  Not performed.  Neurologic:  Alert and oriented x4;  grossly normal neurologically.  Skin:   Dry and intact without significant lesions or rashes. Psychiatric: Oriented to person, place and time. Demonstrates good judgement and reason without abnormal affect or behaviors.   RELEVANT LABS AND IMAGING: CBC    Component Value Date/Time   WBC 7.1 09/05/2023 0025   RBC 5.17 09/05/2023 0025    HGB 15.6 09/05/2023 0025   HCT 46.1 09/05/2023 0025   PLT 191 09/05/2023 0025   MCV 89.2 09/05/2023 0025   MCH 30.2 09/05/2023 0025   MCHC 33.8 09/05/2023 0025   RDW 12.5 09/05/2023 0025   LYMPHSABS 2.6 09/05/2023 0025   MONOABS 0.7 09/05/2023 0025   EOSABS 0.1 09/05/2023 0025   BASOSABS 0.0 09/05/2023 0025    CMP     Component Value Date/Time   NA 145 08/27/2024 0912   K 4.0 08/27/2024 0912   CL 105 08/27/2024 0912   CO2 29 08/27/2024 0912   GLUCOSE 112 (H) 08/27/2024 0912   BUN 16 08/27/2024 0912   CREATININE 1.08 08/27/2024 0912   CREATININE 1.05 05/01/2018 1516   CALCIUM  9.5 08/27/2024 0912   PROT 7.2 10/27/2024 0816   ALBUMIN 4.7 10/27/2024 0816   AST 35 10/27/2024 0816  ALT 77 (H) 10/27/2024 0816   ALKPHOS 47 10/27/2024 0816   BILITOT 1.1 10/27/2024 0816   GFRNONAA >60 09/05/2023 0025   GFRAA >90 02/01/2012 0455     Assessment/Plan:   Assessment & Plan Fatty liver disease Elevated liver enzymes Elevated lipase Has had chronically elevated liver enzymes, previous diagnosis of hepatic steatosis.  Previous extensive workup ruled out other causes.  Last seen for this by Dr. Avram in 2017, has not had follow-up since.  Discussed potential progression to cirrhosis and importance of monitoring liver fibrosis.  He has tried to maintain a healthy lifestyle, exercise, weight loss. Unable to calculate fibrosis 4 score since he has not had a recent CBC.  Will repeat labs today, calculate score, likely will need FibroScan and possibly referral to hepatology. Previous consideration for liver biopsy. Discussed getting a CT scan to evaluate the pancreas based on elevated lipase levels.  Repeat a lipase today as well.  Patient wants to hold off on imaging until next year due to cost. He is asymptomatic. Plan for CT in January if lipase remains elevated, could concurrently evaluate the liver with this exam.  If lipase is normal, consider liver ultrasound with elastography.  -  Labs today: CBC, CMP, lipase - Calculated fibrosis score from labs today - If fibrosis score is elevated, will consider referral to a liver clinic for further evaluation and potential treatment. - Encouraged weight loss, regular exercise, continued avoidance of alcohol, healthy diet with reduction in carbohydrates, management of other medical condition, etc.  Colon cancer screening Discussed colon cancer screening options.  Discussed changing guidelines to start screening at age 70 rather than age 34.  Patient wants to wait until he turns 49 to schedule initial colonoscopy.  Will add him to the recall list.  - Plan for colonoscopy next year when patient turns 49.    Camie Furbish, PA-C [facility] 11/24/2024, 8:58 AM  Patient Care Team: Daryl Setter, NP as PCP - General (Internal Medicine)     [facility] Attending    I agree with the Advanced Practitioner's note, impression and recommendations with the following additions:    Fibrosis 4 Score = 1.14 (Low risk)        Interpretation for patients with NAFLD          <1.30       -  F0-F1 (Low risk)          1.30-2.67 -  Indeterminate           >2.67      -  F3-F4 (High risk)     Validated for ages 39-65       Somewhat puzzling case of abnormal transaminases.  There has been suspected nonalcoholic fatty liver disease but I do not really know why he has had these mild transaminase elevations over time.  Technically with this fibrosis 4 score he does not need elastography.  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  I am not sure I believe that.  Most recent CT scan did not show signs of steatosis.  Await ultrasound and elastography testing.  Liver biopsy may be necessary to truly understand this.  Lupita CHARLENA Avram, MD, [facility]  "

## 2024-11-24 ENCOUNTER — Other Ambulatory Visit (INDEPENDENT_AMBULATORY_CARE_PROVIDER_SITE_OTHER)

## 2024-11-24 ENCOUNTER — Ambulatory Visit: Admitting: Gastroenterology

## 2024-11-24 ENCOUNTER — Encounter: Payer: Self-pay | Admitting: Gastroenterology

## 2024-11-24 ENCOUNTER — Ambulatory Visit: Payer: Self-pay | Admitting: Gastroenterology

## 2024-11-24 VITALS — BP 118/78 | HR 68 | Ht 70.0 in | Wt 215.0 lb

## 2024-11-24 DIAGNOSIS — Z1211 Encounter for screening for malignant neoplasm of colon: Secondary | ICD-10-CM | POA: Diagnosis not present

## 2024-11-24 DIAGNOSIS — R748 Abnormal levels of other serum enzymes: Secondary | ICD-10-CM

## 2024-11-24 DIAGNOSIS — K760 Fatty (change of) liver, not elsewhere classified: Secondary | ICD-10-CM | POA: Diagnosis not present

## 2024-11-24 LAB — CBC WITH DIFFERENTIAL/PLATELET
Basophils Absolute: 0 K/uL (ref 0.0–0.1)
Basophils Relative: 0.4 % (ref 0.0–3.0)
Eosinophils Absolute: 0.1 K/uL (ref 0.0–0.7)
Eosinophils Relative: 1.8 % (ref 0.0–5.0)
HCT: 42.3 % (ref 39.0–52.0)
Hemoglobin: 15 g/dL (ref 13.0–17.0)
Lymphocytes Relative: 33.1 % (ref 12.0–46.0)
Lymphs Abs: 1.9 K/uL (ref 0.7–4.0)
MCHC: 35.5 g/dL (ref 30.0–36.0)
MCV: 87.2 fl (ref 78.0–100.0)
Monocytes Absolute: 0.4 K/uL (ref 0.1–1.0)
Monocytes Relative: 7 % (ref 3.0–12.0)
Neutro Abs: 3.4 K/uL (ref 1.4–7.7)
Neutrophils Relative %: 57.7 % (ref 43.0–77.0)
Platelets: 179 K/uL (ref 150.0–400.0)
RBC: 4.85 Mil/uL (ref 4.22–5.81)
RDW: 13.4 % (ref 11.5–15.5)
WBC: 5.8 K/uL (ref 4.0–10.5)

## 2024-11-24 LAB — COMPREHENSIVE METABOLIC PANEL WITH GFR
ALT: 75 U/L — ABNORMAL HIGH (ref 0–53)
AST: 36 U/L (ref 0–37)
Albumin: 4.7 g/dL (ref 3.5–5.2)
Alkaline Phosphatase: 49 U/L (ref 39–117)
BUN: 10 mg/dL (ref 6–23)
CO2: 27 meq/L (ref 19–32)
Calcium: 9.6 mg/dL (ref 8.4–10.5)
Chloride: 104 meq/L (ref 96–112)
Creatinine, Ser: 0.94 mg/dL (ref 0.40–1.50)
GFR: 95.11 mL/min (ref >60.00)
Glucose, Bld: 102 mg/dL — ABNORMAL HIGH (ref 70–99)
Potassium: 4.1 meq/L (ref 3.5–5.1)
Sodium: 140 meq/L (ref 135–145)
Total Bilirubin: 1 mg/dL (ref 0.2–1.2)
Total Protein: 7.4 g/dL (ref 6.0–8.3)

## 2024-11-24 LAB — LIPASE: Lipase: 42 U/L (ref 11.0–59.0)

## 2024-11-24 NOTE — Patient Instructions (Addendum)
 Your provider has requested that you go to the basement level for lab work before leaving today. Press B on the elevator. The lab is located at the first door on the left as you exit the elevator.  Colonoscopy due 03/2025.   Continue to avoid alcohol.   Continue to work on a healthy diet and weight loss.   _______________________________________________________  If your blood pressure at your visit was 140/90 or greater, please contact your primary care physician to follow up on this.  _______________________________________________________  If you are age 49 or older, your body mass index should be between 23-30. Your Body mass index is 30.85 kg/m. If this is out of the aforementioned range listed, please consider follow up with your Primary Care Provider.  If you are age 30 or younger, your body mass index should be between 19-25. Your Body mass index is 30.85 kg/m. If this is out of the aformentioned range listed, please consider follow up with your Primary Care Provider.   ________________________________________________________  The [facility] providers would like to encourage you to use MYCHART to communicate with providers for non-urgent requests or questions.  Due to long hold times on the telephone, sending your provider a message by [facility] may be a faster and more efficient way to get a response.  Please allow 48 business hours for a response.  Please remember that this is for non-urgent requests.  _______________________________________________________  [facility] is using a team-based approach to care.  Your team is made up of your doctor and two to three APPS. Our APPS (Nurse Practitioners and Physician Assistants) work with your physician to ensure care continuity for you. They are fully qualified to address your health concerns and develop a treatment plan. They communicate directly with your gastroenterologist to care for you. Seeing the Advanced Practice  Practitioners on your physician's team can help you by facilitating care more promptly, often allowing for earlier appointments, access to diagnostic testing, procedures, and other specialty referrals.

## 2024-12-08 ENCOUNTER — Other Ambulatory Visit: Payer: Self-pay

## 2024-12-08 DIAGNOSIS — R748 Abnormal levels of other serum enzymes: Secondary | ICD-10-CM

## 2024-12-08 DIAGNOSIS — K760 Fatty (change of) liver, not elsewhere classified: Secondary | ICD-10-CM

## 2024-12-16 ENCOUNTER — Telehealth: Payer: Self-pay | Admitting: Gastroenterology

## 2024-12-16 NOTE — Telephone Encounter (Signed)
 The order has been faxed as requested and pt advised

## 2024-12-16 NOTE — Telephone Encounter (Signed)
 Inbound call from patient wife stating that she would like for her husband imaging order sent to [facility] imaging in [place]. Good call fax number is [phone]. Patient wife would like a call back to be advised that the order was sent. Please advise.

## 2024-12-22 NOTE — Telephone Encounter (Signed)
 Received a fax with appointment info: U/S appointment is 12/31/2024 at 8:00AM. I will let our pre-cert team know in case they need to pre-cert.

## 2024-12-24 ENCOUNTER — Telehealth: Payer: Self-pay | Admitting: Gastroenterology

## 2024-12-24 DIAGNOSIS — R748 Abnormal levels of other serum enzymes: Secondary | ICD-10-CM

## 2024-12-24 NOTE — Telephone Encounter (Signed)
 Please follow-up with patient regarding liver elastography.  Unclear whether he is having this done with [facility] or if it is being done with us /through [facility].  If getting this done through [facility], results will need to be sent to us .

## 2024-12-27 NOTE — Telephone Encounter (Signed)
 According to the appointment screen it has been cancelled via the wife. It was set up for 01/03/2025 and the note says they are going to call back and r/s this appointment.

## 2024-12-28 NOTE — Telephone Encounter (Signed)
 We received a fax saying he has the

## 2024-12-31 NOTE — Telephone Encounter (Signed)
 Called and spoke with patient. He stated he had his ultrasound done at [facility] this morning. (12/31/2024)

## 2025-01-03 ENCOUNTER — Ambulatory Visit (HOSPITAL_COMMUNITY)

## 2025-01-04 NOTE — Telephone Encounter (Signed)
 I have left a detailed message with [facility] medical records to please send us  the U/S report to fax # [phone].

## 2025-01-04 NOTE — Telephone Encounter (Signed)
 Report has been faxed to us  and I will place it in [place] office for review.

## 2025-01-06 NOTE — Telephone Encounter (Signed)
 I reviewed the ultrasound with elastography report  Nursing staff please contact the patient or wife and tell them it looks like the liver is okay no signs of fibrosis or scarring though there is suspected increased fat in the liver.  Explained that I would like to repeat liver tests and do also do a CK and do an ANA test (he had that done years ago but I want to recheck it). Also check Hepatitis C antibody.   Overall suspect that nothing bad going on at of the liver is okay.  I have ordered the labs.  Once he comes and does those I will regroup with them.

## 2025-01-06 NOTE — Telephone Encounter (Signed)
 The patient has been notified of this information and all questions answered.  The pt will come in as soon as able

## 2025-01-06 NOTE — Addendum Note (Signed)
 Addended by: Jasiah Buntin E on: 01/06/2025 12:58 PM   Modules accepted: Orders

## 2025-01-07 ENCOUNTER — Other Ambulatory Visit (INDEPENDENT_AMBULATORY_CARE_PROVIDER_SITE_OTHER)

## 2025-01-07 DIAGNOSIS — R748 Abnormal levels of other serum enzymes: Secondary | ICD-10-CM | POA: Diagnosis not present

## 2025-01-07 LAB — HEPATIC FUNCTION PANEL
ALT: 86 U/L — ABNORMAL HIGH (ref 3–53)
AST: 34 U/L (ref 5–37)
Albumin: 4.7 g/dL (ref 3.5–5.2)
Alkaline Phosphatase: 50 U/L (ref 39–117)
Bilirubin, Direct: 0.1 mg/dL (ref 0.1–0.3)
Total Bilirubin: 0.8 mg/dL (ref 0.2–1.2)
Total Protein: 7.4 g/dL (ref 6.0–8.3)

## 2025-01-07 LAB — CK: Total CK: 240 U/L — ABNORMAL HIGH (ref 17–232)

## 2025-01-09 LAB — ANA: Anti Nuclear Antibody (ANA): NEGATIVE

## 2025-01-11 ENCOUNTER — Ambulatory Visit: Payer: Self-pay | Admitting: Internal Medicine

## 2025-01-11 DIAGNOSIS — R748 Abnormal levels of other serum enzymes: Secondary | ICD-10-CM

## 2025-01-11 DIAGNOSIS — R7989 Other specified abnormal findings of blood chemistry: Secondary | ICD-10-CM

## 2025-01-11 NOTE — Telephone Encounter (Signed)
 Inbound call from patients wife stating husband has not had any muscle aches and has not done any vigorous exercise. Please advise  Thank you

## 2025-01-11 NOTE — Telephone Encounter (Signed)
 Please advise pt to hold crestor  for 2 weeks, then schedule lab appointment to repeat CK level.

## 2025-01-13 NOTE — Progress Notes (Signed)
 Patient notified of this information and scheduled to come in for labs 01/28/2025

## 2025-01-28 ENCOUNTER — Other Ambulatory Visit

## 2025-01-28 DIAGNOSIS — R7989 Other specified abnormal findings of blood chemistry: Secondary | ICD-10-CM

## 2025-01-28 DIAGNOSIS — R748 Abnormal levels of other serum enzymes: Secondary | ICD-10-CM

## 2025-01-28 LAB — HEPATIC FUNCTION PANEL
ALT: 74 U/L — ABNORMAL HIGH (ref 3–53)
AST: 32 U/L (ref 5–37)
Albumin: 4.6 g/dL (ref 3.5–5.2)
Alkaline Phosphatase: 50 U/L (ref 39–117)
Bilirubin, Direct: 0.1 mg/dL (ref 0.1–0.3)
Total Bilirubin: 0.9 mg/dL (ref 0.2–1.2)
Total Protein: 7.1 g/dL (ref 6.0–8.3)

## 2025-01-28 LAB — CK: Total CK: 219 U/L (ref 17–232)

## 2025-01-31 ENCOUNTER — Ambulatory Visit: Payer: Self-pay | Admitting: Family
